# Patient Record
Sex: Female | Born: 1990 | Race: Black or African American | Hispanic: No | Marital: Single | Smoking: Never smoker
Health system: Southern US, Community
[De-identification: ages and names within clinical notes are randomized; demographics above are authoritative.]

## PROBLEM LIST (undated history)

## (undated) HISTORY — PX: TUBAL LIGATION: SHX77

## (undated) HISTORY — PX: FETAL SURGERY FOR CONGENITAL HERNIA: SHX1618

---
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
## 2010-02-20 ENCOUNTER — Emergency Department (HOSPITAL_COMMUNITY)
Admission: EM | Admit: 2010-02-20 | Discharge: 2010-02-20 | Payer: Self-pay | Source: Home / Self Care | Admitting: Emergency Medicine

## 2010-02-22 ENCOUNTER — Emergency Department (HOSPITAL_COMMUNITY)
Admission: EM | Admit: 2010-02-22 | Discharge: 2010-02-22 | Payer: Self-pay | Source: Home / Self Care | Admitting: Emergency Medicine

## 2010-02-23 LAB — URINE MICROSCOPIC-ADD ON

## 2010-02-23 LAB — URINALYSIS, ROUTINE W REFLEX MICROSCOPIC
Hgb urine dipstick: NEGATIVE
Specific Gravity, Urine: 1.027 (ref 1.005–1.030)
Urine Glucose, Fasting: NEGATIVE mg/dL

## 2010-04-20 LAB — DIFFERENTIAL
Eosinophils Relative: 1 % (ref 0–5)
Lymphocytes Relative: 23 % (ref 12–46)
Lymphs Abs: 2.4 10*3/uL (ref 0.7–4.0)

## 2010-04-20 LAB — ETHANOL: Alcohol, Ethyl (B): <5 mg/dL (ref 0–10)

## 2010-04-20 LAB — ACETAMINOPHEN LEVEL: Acetaminophen (Tylenol), Serum: 52.4 ug/mL — ABNORMAL HIGH (ref 10–30)

## 2010-04-20 LAB — BASIC METABOLIC PANEL
BUN: 17 mg/dL (ref 6–23)
GFR calc non Af Amer: >60 mL/min (ref >60)
Potassium: 4 mEq/L (ref 3.5–5.1)
Sodium: 141 mEq/L (ref 135–145)

## 2010-04-20 LAB — URINALYSIS, ROUTINE W REFLEX MICROSCOPIC
Bilirubin Urine: NEGATIVE
Ketones, ur: NEGATIVE mg/dL
Nitrite: NEGATIVE
Protein, ur: NEGATIVE mg/dL
Urobilinogen, UA: 1 mg/dL (ref 0.0–1.0)

## 2010-04-20 LAB — CBC
HCT: 38.5 % (ref 36.0–46.0)
Platelets: 140 10*3/uL — ABNORMAL LOW (ref 150–400)
WBC: 10.4 10*3/uL (ref 4.0–10.5)

## 2010-04-20 LAB — URINE MICROSCOPIC-ADD ON

## 2010-04-23 LAB — RAPID STREP SCREEN (MED CTR MEBANE ONLY): Streptococcus, Group A Screen (Direct): NEGATIVE

## 2010-05-31 ENCOUNTER — Emergency Department (HOSPITAL_COMMUNITY)
Admission: EM | Admit: 2010-05-31 | Discharge: 2010-05-31 | Disposition: A | Discharge status: Home / Self Care | Payer: Self-pay | Attending: Emergency Medicine | Admitting: Emergency Medicine

## 2010-05-31 DIAGNOSIS — M940 Chondrocostal junction syndrome [Tietze]: Secondary | ICD-10-CM | POA: Insufficient documentation

## 2010-05-31 DIAGNOSIS — R0789 Other chest pain: Secondary | ICD-10-CM | POA: Insufficient documentation

## 2010-07-14 ENCOUNTER — Inpatient Hospital Stay (INDEPENDENT_AMBULATORY_CARE_PROVIDER_SITE_OTHER)
Admission: RE | Admit: 2010-07-14 | Discharge: 2010-07-14 | Disposition: A | Discharge status: Home / Self Care | Payer: Self-pay | Source: Ambulatory Visit | Attending: Family Medicine | Admitting: Family Medicine

## 2010-07-14 DIAGNOSIS — R112 Nausea with vomiting, unspecified: Secondary | ICD-10-CM

## 2010-07-14 LAB — POCT URINALYSIS DIP (DEVICE)
Bilirubin Urine: NEGATIVE
Glucose, UA: NEGATIVE mg/dL
Ketones, ur: NEGATIVE mg/dL
Protein, ur: NEGATIVE mg/dL
Specific Gravity, Urine: 1.02 (ref 1.005–1.030)

## 2010-07-14 LAB — POCT PREGNANCY, URINE: Preg Test, Ur: NEGATIVE

## 2010-12-17 ENCOUNTER — Emergency Department (INDEPENDENT_AMBULATORY_CARE_PROVIDER_SITE_OTHER)
Admission: EM | Admit: 2010-12-17 | Discharge: 2010-12-17 | Disposition: A | Discharge status: Home / Self Care | Payer: Self-pay | Source: Home / Self Care | Attending: Family Medicine | Admitting: Family Medicine

## 2010-12-17 DIAGNOSIS — N898 Other specified noninflammatory disorders of vagina: Secondary | ICD-10-CM

## 2010-12-17 LAB — WET PREP, GENITAL: Yeast Wet Prep HPF POC: NONE SEEN

## 2010-12-17 LAB — POCT PREGNANCY, URINE: Preg Test, Ur: NEGATIVE

## 2010-12-17 MED ORDER — METRONIDAZOLE 500 MG PO TABS: 500.0000 mg | ORAL_TABLET | Freq: Two times a day (BID) | ORAL | Status: AC

## 2010-12-17 NOTE — ED Provider Notes (Signed)
History     CSN: [id number] Arrival date & time: 12/17/2010  4:27 PM   First MD Initiated Contact with Patient 12/17/10 1644      Chief Complaint  Patient presents with  . Vaginal Discharge    (Consider location/radiation/quality/duration/timing/severity/associated sxs/prior treatment) HPI Comments: Kristen Robles presents for evaluation of new onset malodorous, vaginal discharge. She reports a new sexual partner, with whom she uses condoms. She has never had a discharge like this before.  Patient is a 20 y.o. female presenting with vaginal discharge. The history is provided by the patient.  Vaginal Discharge This is a new problem. The problem occurs constantly. Pertinent negatives include no abdominal pain.    History reviewed. No pertinent past medical history.  History reviewed. No pertinent past surgical history.  No family history on file.  History  Substance Use Topics  . Smoking status: Never Smoker   . Smokeless tobacco: Not on file  . Alcohol Use: No    OB History    Grav Para Term Preterm Abortions TAB SAB Ect Mult Living                  Review of Systems  Constitutional: Negative.   HENT: Negative.   Eyes: Negative.   Respiratory: Negative.   Gastrointestinal: Negative.  Negative for abdominal pain.  Genitourinary: Positive for vaginal discharge.  Musculoskeletal: Negative.   Neurological: Negative.     Allergies  Review of patient's allergies indicates no known allergies.  Home Medications  No current outpatient prescriptions on file.  BP 125/68  Pulse 91  Temp(Src) 98.4 F (36.9 C) (Oral)  Resp 16  SpO2 100%  LMP 12/03/2010  Physical Exam  Constitutional: She appears well-developed and well-nourished.  Eyes: EOM are normal.  Pulmonary/Chest: Effort normal.  Genitourinary: Cervix exhibits no discharge and no friability. No tenderness or bleeding around the vagina. Vaginal discharge found.    ED Course  Procedures (including critical care  time)   Labs Reviewed  POCT PREGNANCY, URINE  POCT URINALYSIS DIPSTICK   No results found.   No diagnosis found.    MDM  Exam reveals thin, milky discharge, consistent with possible BV; will treat empirically, labs pending.        Richardo Priest, MD 12/17/10 [phone]

## 2010-12-17 NOTE — ED Notes (Signed)
C/o vaginal discharge with odor for 2 days.  Denies itching or urinary sx. Denies pain

## 2010-12-18 LAB — GC/CHLAMYDIA PROBE AMP, GENITAL
Chlamydia, DNA Probe: NEGATIVE
GC Probe Amp, Genital: NEGATIVE

## 2011-05-05 ENCOUNTER — Emergency Department (HOSPITAL_COMMUNITY)
Admission: EM | Admit: 2011-05-05 | Discharge: 2011-05-05 | Disposition: A | Discharge status: Home / Self Care | Payer: Self-pay | Attending: Emergency Medicine | Admitting: Emergency Medicine

## 2011-05-05 ENCOUNTER — Encounter (HOSPITAL_COMMUNITY): Payer: Self-pay

## 2011-05-05 DIAGNOSIS — R112 Nausea with vomiting, unspecified: Secondary | ICD-10-CM | POA: Insufficient documentation

## 2011-05-05 DIAGNOSIS — R197 Diarrhea, unspecified: Secondary | ICD-10-CM | POA: Insufficient documentation

## 2011-05-05 DIAGNOSIS — R109 Unspecified abdominal pain: Secondary | ICD-10-CM | POA: Insufficient documentation

## 2011-05-05 LAB — URINALYSIS, ROUTINE W REFLEX MICROSCOPIC
Hgb urine dipstick: NEGATIVE
Protein, ur: NEGATIVE mg/dL
Urobilinogen, UA: 0.2 mg/dL (ref 0.0–1.0)

## 2011-05-05 LAB — PREGNANCY, URINE: Preg Test, Ur: NEGATIVE

## 2011-05-05 MED ORDER — DICYCLOMINE HCL 10 MG PO CAPS
10.0000 mg | ORAL_CAPSULE | ORAL | Status: AC
  Administered 2011-05-05: 10 mg via ORAL
  Filled 2011-05-05: qty 1

## 2011-05-05 MED ORDER — ONDANSETRON HCL 4 MG PO TABS: 4.0000 mg | ORAL_TABLET | Freq: Four times a day (QID) | ORAL | Status: AC

## 2011-05-05 MED ORDER — ONDANSETRON 4 MG PO TBDP
4.0000 mg | ORAL_TABLET | Freq: Once | ORAL | Status: AC
  Administered 2011-05-05: 4 mg via ORAL
  Filled 2011-05-05 (×2): qty 1

## 2011-05-05 MED ORDER — DICYCLOMINE HCL 20 MG PO TABS: 20.0000 mg | ORAL_TABLET | Freq: Two times a day (BID) | ORAL | Status: DC

## 2011-05-05 NOTE — ED Notes (Signed)
Patient states she is having abdomen and bilateral side pain and N/V/D x 5 days. Patient denies fever.

## 2011-05-05 NOTE — ED Provider Notes (Signed)
History     CSN: [id number]  Arrival date & time 05/05/11  1525   First MD Initiated Contact with Patient 05/05/11 1649      Chief Complaint  Patient presents with  . Abdominal Pain    (Consider location/radiation/quality/duration/timing/severity/associated sxs/prior treatment) HPI  Patient who states she has no known medical problems and takes no medicine on regular basis presents to emergency department complaining of a 5-6 day history of intermittent lower abdominal cramping with nausea vomiting and diarrhea. Patient states that for the last 5 days she'll have intermittent lower abdominal cramping that she states is "very sharp and severe" and will last for a few minutes then resolve. These episodes of abdominal cramping are at imes associated with nausea vomiting diarrhea and at other times she will have pain alone. Patient states she's felt nauseated for the last 5 days and has had vomiting times multiple episodes intermittently. However patient has been able to tolerate bland food and water despite intermittent vomiting. Patient denies any known fevers, chills, chest pain, shortness of breath, dysuria, hematuria, blood in her stool, vaginal discharge or pelvic pain. She denies recent abx use. Patient denies any sick contacts or family or friends with similar symptoms. Patient states she has no known medical problems and no history of surgeries. Patient states she's taken Pepto-Bismol and over-the-counter pain medicines over the last 5 days with some temporary relief of symptoms. Patient denies history of similar pain.  History reviewed. No pertinent past medical history.  History reviewed. No pertinent past surgical history.  No family history on file.  History  Substance Use Topics  . Smoking status: Never Smoker   . Smokeless tobacco: Not on file  . Alcohol Use: No    OB History    Grav Para Term Preterm Abortions TAB SAB Ect Mult Living                  Review of Systems   All other systems reviewed and are negative.    Allergies  Tomato  Home Medications   Current Outpatient Rx  Name Route Sig Dispense Refill  . PAIN RELIEF PO Oral Take 2 tablets by mouth as needed. For pain.      BP 101/69  Pulse 70  Temp(Src) 98.3 F (36.8 C) (Oral)  Resp 18  SpO2 100%  LMP 04/25/2011  Physical Exam  Nursing note and vitals reviewed. Constitutional: She is oriented to person, place, and time. She appears well-developed and well-nourished. No distress.  HENT:  Head: Normocephalic and atraumatic.  Eyes: Conjunctivae are normal.  Neck: Normal range of motion. Neck supple.  Cardiovascular: Normal rate, regular rhythm, normal heart sounds and intact distal pulses.  Exam reveals no gallop and no friction rub.   No murmur heard. Pulmonary/Chest: Effort normal and breath sounds normal. No respiratory distress. She has no wheezes. She has no rales. She exhibits no tenderness.  Abdominal: Soft. Bowel sounds are normal. She exhibits no distension and no mass. There is no tenderness. There is no rebound and no guarding.  Musculoskeletal: Normal range of motion. She exhibits no edema and no tenderness.  Neurological: She is alert and oriented to person, place, and time.  Skin: Skin is warm and dry. No rash noted. She is not diaphoretic. No erythema.  Psychiatric: She has a normal mood and affect.    ED Course  Procedures (including critical care time)  ODT zofran and PO bentyl.    Labs Reviewed  PREGNANCY, URINE  URINALYSIS, ROUTINE  W REFLEX MICROSCOPIC   No results found.   1. Abdominal cramping   2. Nausea vomiting and diarrhea       MDM  Patient is afebrile, with abdomen completely soft and nontender denying any vaginal d/c or pelvic pain. She is tolerating by mouth fluids well. Patient is describing intermittent abdominal cramping with nausea, vomiting, and diarrhea potentially consistent with gastroenteritis versus IBS. Patient has nonacute, non  tender abdomen at this time and throughout ER evaluation. Will send her home with Zofran and Bentyl with primary care referral. Spoke at length with patient about worrisome signs and symptoms that should prompt return to ER. She was in understanding and is agreeable to plan.        Jenness Corner, [place] 05/05/11 1810

## 2011-05-05 NOTE — ED Notes (Signed)
Abdominal pain n/v/d

## 2011-05-05 NOTE — Discharge Instructions (Signed)
Take bentyl as directed for abdominal cramping and pain and zofran as needed for nausea. You may also consider over-the-counter antidiarrheal such as Lomotil. Follow up with a primary care provider in 1-2 weeks for recheck of ongoing symptoms but returned to emergency department for changing or worsening symptoms.  Abdominal Pain Abdominal pain can be caused by many things. Your caregiver decides the seriousness of your pain by an examination and possibly blood tests and X-rays. Many cases can be observed and treated at home. Most abdominal pain is not caused by a disease and will probably improve without treatment. However, in many cases, more time must pass before a clear cause of the pain can be found. Before that point, it may not be known if you need more testing, or if hospitalization or surgery is needed. HOME CARE INSTRUCTIONS   Do not take laxatives unless directed by your caregiver.   Take pain medicine only as directed by your caregiver.   Only take over-the-counter or prescription medicines for pain, discomfort, or fever as directed by your caregiver.   Try a clear liquid diet (broth, tea, or water) for as long as directed by your caregiver. Slowly move to a bland diet as tolerated.  SEEK IMMEDIATE MEDICAL CARE IF:   The pain does not go away.   You have a fever.   You keep throwing up (vomiting).   The pain is felt only in portions of the abdomen. Pain in the right side could possibly be appendicitis. In an adult, pain in the left lower portion of the abdomen could be colitis or diverticulitis.   You pass bloody or black tarry stools.  MAKE SURE YOU:   Understand these instructions.   Will watch your condition.   Will get help right away if you are not doing well or get worse.  Document Released: 10/27/2004 Document Revised: 01/06/2011 Document Reviewed: 09/05/2007 [facility] Patient Information 2012 [place], [place].

## 2011-05-06 NOTE — ED Provider Notes (Signed)
Medical screening examination/treatment/procedure(s) were performed by non-physician practitioner and as supervising physician I was immediately available for consultation/collaboration.   Loren Racer, MD 05/06/11 [phone]

## 2011-09-03 ENCOUNTER — Encounter (HOSPITAL_COMMUNITY): Payer: Self-pay | Admitting: *Deleted

## 2011-09-03 ENCOUNTER — Emergency Department (HOSPITAL_COMMUNITY)
Admission: EM | Admit: 2011-09-03 | Discharge: 2011-09-04 | Disposition: A | Discharge status: Home / Self Care | Payer: Self-pay | Attending: Emergency Medicine | Admitting: Emergency Medicine

## 2011-09-03 DIAGNOSIS — R112 Nausea with vomiting, unspecified: Secondary | ICD-10-CM

## 2011-09-03 DIAGNOSIS — R197 Diarrhea, unspecified: Secondary | ICD-10-CM | POA: Insufficient documentation

## 2011-09-03 DIAGNOSIS — R109 Unspecified abdominal pain: Secondary | ICD-10-CM | POA: Insufficient documentation

## 2011-09-03 LAB — COMPREHENSIVE METABOLIC PANEL
Alkaline Phosphatase: 36 U/L — ABNORMAL LOW (ref 39–117)
BUN: 12 mg/dL (ref 6–23)
Chloride: 102 mEq/L (ref 96–112)
GFR calc Af Amer: >90 mL/min (ref >90)
Glucose, Bld: 97 mg/dL (ref 70–99)
Potassium: 3.4 mEq/L — ABNORMAL LOW (ref 3.5–5.1)
Total Bilirubin: 0.3 mg/dL (ref 0.3–1.2)

## 2011-09-03 LAB — PREGNANCY, URINE: Preg Test, Ur: NEGATIVE

## 2011-09-03 LAB — CBC WITH DIFFERENTIAL/PLATELET
Basophils Absolute: 0 K/uL (ref 0.0–0.1)
Basophils Relative: 0 % (ref 0–1)
Eosinophils Absolute: 0.1 K/uL (ref 0.0–0.7)
Eosinophils Relative: 1 % (ref 0–5)
HCT: 39.4 % (ref 36.0–46.0)
Hemoglobin: 13.5 g/dL (ref 12.0–15.0)
Lymphocytes Relative: 29 % (ref 12–46)
Lymphs Abs: 2.5 10*3/uL (ref 0.7–4.0)
MCH: 33.2 pg (ref 26.0–34.0)
MCHC: 34.3 g/dL (ref 30.0–36.0)
MCV: 96.8 fL (ref 78.0–100.0)
Monocytes Absolute: 0.6 K/uL (ref 0.1–1.0)
Monocytes Relative: 7 % (ref 3–12)
Neutro Abs: 5.6 10*3/uL (ref 1.7–7.7)
Neutrophils Relative %: 63 % (ref 43–77)
Platelets: 142 K/uL — ABNORMAL LOW (ref 150–400)
RBC: 4.07 MIL/uL (ref 3.87–5.11)
RDW: 12.2 % (ref 11.5–15.5)
WBC: 8.8 K/uL (ref 4.0–10.5)

## 2011-09-03 LAB — COMPREHENSIVE METABOLIC PANEL WITH GFR
ALT: 7 U/L (ref 0–35)
AST: 17 U/L (ref 0–37)
Albumin: 4.5 g/dL (ref 3.5–5.2)
CO2: 26 meq/L (ref 19–32)
Calcium: 9.6 mg/dL (ref 8.4–10.5)
Creatinine, Ser: 0.67 mg/dL (ref 0.50–1.10)
GFR calc non Af Amer: >90 mL/min (ref >90)
Sodium: 139 meq/L (ref 135–145)
Total Protein: 7.9 g/dL (ref 6.0–8.3)

## 2011-09-03 LAB — URINALYSIS, ROUTINE W REFLEX MICROSCOPIC
Bilirubin Urine: NEGATIVE
Glucose, UA: NEGATIVE mg/dL
Hgb urine dipstick: NEGATIVE
Ketones, ur: 15 mg/dL — AB
Nitrite: NEGATIVE
Protein, ur: NEGATIVE mg/dL
Specific Gravity, Urine: 1.025 (ref 1.005–1.030)
Urobilinogen, UA: 1 mg/dL (ref 0.0–1.0)
pH: 6.5 (ref 5.0–8.0)

## 2011-09-03 LAB — URINE MICROSCOPIC-ADD ON

## 2011-09-03 NOTE — ED Notes (Signed)
Lower abd pain for one week with nv and diarrhea with lower back pain and a headache.  lmp last month

## 2011-09-04 ENCOUNTER — Encounter (HOSPITAL_COMMUNITY): Payer: Self-pay | Admitting: Emergency Medicine

## 2011-09-04 MED ORDER — SODIUM CHLORIDE 0.9 % IV BOLUS (SEPSIS)
1000.0000 mL | Freq: Once | INTRAVENOUS | Status: AC
  Administered 2011-09-04: 1000 mL via INTRAVENOUS

## 2011-09-04 MED ORDER — ONDANSETRON 8 MG PO TBDP: 8.0000 mg | ORAL_TABLET | Freq: Two times a day (BID) | ORAL | Status: AC | PRN

## 2011-09-04 MED ORDER — PANTOPRAZOLE SODIUM 40 MG PO TBEC: 40.0000 mg | DELAYED_RELEASE_TABLET | Freq: Every day | ORAL | Status: DC

## 2011-09-04 MED ORDER — FENTANYL CITRATE 0.05 MG/ML IJ SOLN
50.0000 ug | Freq: Once | INTRAMUSCULAR | Status: AC
  Administered 2011-09-04: 50 ug via INTRAVENOUS
  Filled 2011-09-04: qty 2

## 2011-09-04 MED ORDER — ONDANSETRON HCL 4 MG/2ML IJ SOLN
4.0000 mg | Freq: Once | INTRAMUSCULAR | Status: AC
  Administered 2011-09-04: 4 mg via INTRAVENOUS
  Filled 2011-09-04: qty 2

## 2011-09-04 MED ORDER — PANTOPRAZOLE SODIUM 40 MG IV SOLR
40.0000 mg | Freq: Once | INTRAVENOUS | Status: AC
  Administered 2011-09-04: 40 mg via INTRAVENOUS
  Filled 2011-09-04: qty 40

## 2011-09-04 MED ORDER — LOPERAMIDE HCL 2 MG PO CAPS: 2.0000 mg | ORAL_CAPSULE | Freq: Four times a day (QID) | ORAL | Status: AC | PRN

## 2011-09-04 NOTE — ED Notes (Signed)
Patient is alert and oriented x4 with no complaints of pain.  Patient's friends are here to transport her home.  Discharge instructions were explained and patient had no questions.

## 2011-09-04 NOTE — ED Provider Notes (Addendum)
History     CSN: [id number]  Arrival date & time 09/03/11  2206   First MD Initiated Contact with Patient 09/03/11 2357      Chief Complaint  Patient presents with  . Abdominal Pain    (Consider location/radiation/quality/duration/timing/severity/associated sxs/prior treatment) HPI Comments: Patient reports that she began having some loose stools approximately 3 days ago associated with some resultant lower abdominal crampy discomfort that is intermittent and will radiate to her sides. She had approximately 6 or 7 episodes of diarrhea in the last 24 hours. She denies smoking or drug swollen very rarely drink a glass of wine and has not had any alcohol recently. She denies any fever or chills. Her appetite has been down. She has not taken any medications for the diarrhea. Today the symptoms got worse and she reports that she vomited profusely after trying to eat and drink small amounts. Therefore she came to the emergency department. At this moment, she reports no abdominal pain or tenderness. She denies dysuria. She reports that she had 2 menstrual cycles last month which is somewhat unusual for her but denies any current vaginal bleeding or discharge. She reports that she does use condoms for birth control. She denies any foreign travel or any obvious sick contacts.  Patient is a 21 y.o. female presenting with abdominal pain. The history is provided by the patient and a friend.  Abdominal Pain The primary symptoms of the illness include abdominal pain, nausea, vomiting and diarrhea. The primary symptoms of the illness do not include fever, vaginal discharge or vaginal bleeding.  Symptoms associated with the illness do not include chills, urgency or back pain.    History reviewed. No pertinent past medical history.  History reviewed. No pertinent past surgical history.  History reviewed. No pertinent family history.  History  Substance Use Topics  . Smoking status: Never Smoker   .  Smokeless tobacco: Not on file  . Alcohol Use: 0.6 oz/week    1 Glasses of wine per week    OB History    Grav Para Term Preterm Abortions TAB SAB Ect Mult Living                  Review of Systems  Constitutional: Positive for appetite change. Negative for fever and chills.  HENT: Negative for congestion, rhinorrhea and postnasal drip.   Gastrointestinal: Positive for nausea, vomiting, abdominal pain and diarrhea.  Genitourinary: Negative for urgency, flank pain, vaginal bleeding, vaginal discharge, vaginal pain and pelvic pain.  Musculoskeletal: Negative for back pain.  Neurological: Negative for syncope and light-headedness.  All other systems reviewed and are negative.    Allergies  Tomato  Home Medications   Current Outpatient Rx  Name Route Sig Dispense Refill  . IBUPROFEN 200 MG PO TABS Oral Take 400 mg by mouth every 6 (six) hours as needed. For pain    . LOPERAMIDE HCL 2 MG PO CAPS Oral Take 1 capsule (2 mg total) by mouth 4 (four) times daily as needed for diarrhea or loose stools. 12 capsule 0  . ONDANSETRON 8 MG PO TBDP Oral Take 1 tablet (8 mg total) by mouth every 12 (twelve) hours as needed for nausea. 20 tablet 0  . PANTOPRAZOLE SODIUM 40 MG PO TBEC Oral Take 1 tablet (40 mg total) by mouth daily. 14 tablet 0    BP 130/71  Pulse 82  Temp 97.6 F (36.4 C) (Oral)  Resp 18  Ht 5\' 2"  (1.575 m)  Wt 105 lb (  47.628 kg)  BMI 19.20 kg/m2  SpO2 99%  LMP 08/03/2011  Physical Exam  Nursing note and vitals reviewed. Constitutional: She is oriented to person, place, and time. She appears well-developed and well-nourished.  HENT:  Head: Normocephalic and atraumatic.  Mouth/Throat: Uvula is midline.       Minimally dry MM  Eyes: Pupils are equal, round, and reactive to light. No scleral icterus.  Neck: Normal range of motion. Neck supple.  Pulmonary/Chest: Effort normal. No respiratory distress. She has no wheezes.  Abdominal: Soft. Bowel sounds are normal. She  exhibits no distension. There is no tenderness. There is no rebound and no guarding.  Musculoskeletal: Normal range of motion.  Neurological: She is alert and oriented to person, place, and time. No cranial nerve deficit.  Skin: Skin is warm and dry.    ED Course  Procedures (including critical care time)  Labs Reviewed  URINALYSIS, ROUTINE W REFLEX MICROSCOPIC - Abnormal; Notable for the following:    APPearance CLOUDY (*)     Ketones, ur 15 (*)     Leukocytes, UA SMALL (*)     All other components within normal limits  CBC WITH DIFFERENTIAL - Abnormal; Notable for the following:    Platelets 142 (*)     All other components within normal limits  COMPREHENSIVE METABOLIC PANEL - Abnormal; Notable for the following:    Potassium 3.4 (*)     Alkaline Phosphatase 36 (*)     All other components within normal limits  URINE MICROSCOPIC-ADD ON - Abnormal; Notable for the following:    Squamous Epithelial / LPF MANY (*)     Bacteria, UA MANY (*)     All other components within normal limits  PREGNANCY, URINE  URINE CULTURE   No results found.   1. Nausea vomiting and diarrhea     12:49 AM Urinalysis is likely contaminated. However given she does have some lower abdominal discomfort, will send as a culture for followup.  2:59 AM Patient reexamination shows a soft non-tender without guarding or rebound examination. Patient has not vomited any further. She received IV fluids. She reports that the IV pain medicine it made her a little dizzy. I gave her some additional IV antiemetic and she has tolerated by mouth fluids. Will discharge her home and she can return if pain or fever returns and otherwise she can take medication for nausea and diarrhea at home and instructions on Brat diet return as well.  MDM   Appears well, afebrile and normotensive. She likely is mildly dehydrated do to several days of diarrheal stools. Her abdomen is soft and no guarding or rebound. I do not suspect  any pelvic or GYN symptoms given the diarrhea and vomiting. Plan is to give her IV fluids, IV antiemetics and some mild analgesics and Protonix and will monitor her and reassess for improvement. Her urinalysis showed mild ketones but no other abnormalities. The rest of her blood tests are unremarkable. Her hCG is negative.      Gavin Pound. Kasee Hantz, MD 09/04/11 0300  Gavin Pound. Oletta Lamas, MD 09/04/11 1610  Gavin Pound. Sinahi Knights, MD 09/04/11 [id number]

## 2011-09-04 NOTE — Discharge Instructions (Signed)
 B.R.A.T. Diet Your doctor has recommended the B.R.A.T. diet for you or your child until the condition improves. This is often used to help control diarrhea and vomiting symptoms. If you or your child can tolerate clear liquids, you may have:  Bananas.   Rice.   Applesauce.   Toast (and other simple starches such as crackers, potatoes, noodles).  Be sure to avoid dairy products, meats, and fatty foods until symptoms are better. Fruit juices such as apple, grape, and prune juice can make diarrhea worse. Avoid these. Continue this diet for 2 days or as instructed by your caregiver. Document Released: 01/17/2005 Document Revised: 01/06/2011 Document Reviewed: 07/06/2006 [facility] Patient Information 2012 [place], [place].

## 2011-09-05 LAB — URINE CULTURE: Colony Count: 7000

## 2014-05-29 ENCOUNTER — Emergency Department (HOSPITAL_COMMUNITY): Payer: No Typology Code available for payment source

## 2014-05-29 ENCOUNTER — Emergency Department (HOSPITAL_COMMUNITY)
Admission: EM | Admit: 2014-05-29 | Discharge: 2014-05-29 | Disposition: A | Discharge status: Home / Self Care | Payer: No Typology Code available for payment source | Attending: Emergency Medicine | Admitting: Emergency Medicine

## 2014-05-29 ENCOUNTER — Encounter (HOSPITAL_COMMUNITY): Payer: Self-pay

## 2014-05-29 DIAGNOSIS — S79921A Unspecified injury of right thigh, initial encounter: Secondary | ICD-10-CM | POA: Insufficient documentation

## 2014-05-29 DIAGNOSIS — Y92410 Unspecified street and highway as the place of occurrence of the external cause: Secondary | ICD-10-CM | POA: Insufficient documentation

## 2014-05-29 DIAGNOSIS — M542 Cervicalgia: Secondary | ICD-10-CM

## 2014-05-29 DIAGNOSIS — R42 Dizziness and giddiness: Secondary | ICD-10-CM | POA: Diagnosis not present

## 2014-05-29 DIAGNOSIS — M545 Low back pain, unspecified: Secondary | ICD-10-CM

## 2014-05-29 DIAGNOSIS — S3992XA Unspecified injury of lower back, initial encounter: Secondary | ICD-10-CM | POA: Diagnosis present

## 2014-05-29 DIAGNOSIS — Y998 Other external cause status: Secondary | ICD-10-CM | POA: Insufficient documentation

## 2014-05-29 DIAGNOSIS — M25562 Pain in left knee: Secondary | ICD-10-CM

## 2014-05-29 DIAGNOSIS — S8992XA Unspecified injury of left lower leg, initial encounter: Secondary | ICD-10-CM | POA: Insufficient documentation

## 2014-05-29 DIAGNOSIS — Y9389 Activity, other specified: Secondary | ICD-10-CM | POA: Insufficient documentation

## 2014-05-29 DIAGNOSIS — S199XXA Unspecified injury of neck, initial encounter: Secondary | ICD-10-CM | POA: Diagnosis not present

## 2014-05-29 MED ORDER — METHOCARBAMOL 500 MG PO TABS: 500.0000 mg | ORAL_TABLET | Freq: Two times a day (BID) | ORAL | Status: DC

## 2014-05-29 MED ORDER — NAPROXEN 500 MG PO TABS: 500.0000 mg | ORAL_TABLET | Freq: Two times a day (BID) | ORAL | Status: DC

## 2014-05-29 MED ORDER — HYDROCODONE-ACETAMINOPHEN 5-325 MG PO TABS
2.0000 | ORAL_TABLET | Freq: Once | ORAL | Status: AC
  Administered 2014-05-29: 2 via ORAL
  Filled 2014-05-29: qty 2

## 2014-05-29 MED ORDER — TRAMADOL HCL 50 MG PO TABS: 50.0000 mg | ORAL_TABLET | Freq: Four times a day (QID) | ORAL | Status: DC | PRN
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
## 2022-03-20 ENCOUNTER — Emergency Department (HOSPITAL_COMMUNITY): Payer: Medicaid Other

## 2022-03-20 ENCOUNTER — Emergency Department (HOSPITAL_BASED_OUTPATIENT_CLINIC_OR_DEPARTMENT_OTHER): Payer: Medicaid Other

## 2022-03-20 ENCOUNTER — Other Ambulatory Visit: Payer: Self-pay

## 2022-03-20 ENCOUNTER — Inpatient Hospital Stay (HOSPITAL_COMMUNITY)
Admission: EM | Admit: 2022-03-20 | Discharge: 2022-03-28 | DRG: 464 | Disposition: A | Discharge status: Home / Self Care | Payer: Medicaid Other | Attending: Internal Medicine | Admitting: Internal Medicine

## 2022-03-20 ENCOUNTER — Encounter (HOSPITAL_COMMUNITY): Payer: Self-pay | Admitting: Emergency Medicine

## 2022-03-20 DIAGNOSIS — K219 Gastro-esophageal reflux disease without esophagitis: Secondary | ICD-10-CM | POA: Diagnosis present

## 2022-03-20 DIAGNOSIS — M009 Pyogenic arthritis, unspecified: Principal | ICD-10-CM | POA: Diagnosis present

## 2022-03-20 DIAGNOSIS — Z885 Allergy status to narcotic agent status: Secondary | ICD-10-CM

## 2022-03-20 DIAGNOSIS — D649 Anemia, unspecified: Secondary | ICD-10-CM | POA: Diagnosis not present

## 2022-03-20 DIAGNOSIS — M79609 Pain in unspecified limb: Secondary | ICD-10-CM

## 2022-03-20 DIAGNOSIS — N939 Abnormal uterine and vaginal bleeding, unspecified: Secondary | ICD-10-CM | POA: Diagnosis present

## 2022-03-20 DIAGNOSIS — E876 Hypokalemia: Secondary | ICD-10-CM | POA: Diagnosis present

## 2022-03-20 DIAGNOSIS — L02415 Cutaneous abscess of right lower limb: Secondary | ICD-10-CM | POA: Diagnosis present

## 2022-03-20 DIAGNOSIS — M79651 Pain in right thigh: Secondary | ICD-10-CM

## 2022-03-20 LAB — COMPREHENSIVE METABOLIC PANEL
ALT: 17 U/L (ref 0–44)
AST: 21 U/L (ref 15–41)
Albumin: 3.7 g/dL (ref 3.5–5.0)
Alkaline Phosphatase: 43 U/L (ref 38–126)
Anion gap: 9 (ref 5–15)
BUN: 10 mg/dL (ref 6–20)
CO2: 26 mmol/L (ref 22–32)
Calcium: 9.3 mg/dL (ref 8.9–10.3)
Chloride: 99 mmol/L (ref 98–111)
Creatinine, Ser: 0.69 mg/dL (ref 0.44–1.00)
GFR, Estimated: >60 mL/min (ref >60)
Glucose, Bld: 112 mg/dL — ABNORMAL HIGH (ref 70–99)
Potassium: 3.6 mmol/L (ref 3.5–5.1)
Sodium: 134 mmol/L — ABNORMAL LOW (ref 135–145)
Total Bilirubin: 0.5 mg/dL (ref 0.3–1.2)
Total Protein: 7.4 g/dL (ref 6.5–8.1)

## 2022-03-20 LAB — CBC WITH DIFFERENTIAL/PLATELET
Abs Immature Granulocytes: 0.04 K/uL (ref 0.00–0.07)
Basophils Absolute: 0 K/uL (ref 0.0–0.1)
Basophils Relative: 0 %
Eosinophils Absolute: 0 K/uL (ref 0.0–0.5)
Eosinophils Relative: 0 %
HCT: 39.4 % (ref 36.0–46.0)
Hemoglobin: 13.1 g/dL (ref 12.0–15.0)
Immature Granulocytes: 0 %
Lymphocytes Relative: 15 %
Lymphs Abs: 2.2 K/uL (ref 0.7–4.0)
MCH: 32.4 pg (ref 26.0–34.0)
MCHC: 33.2 g/dL (ref 30.0–36.0)
MCV: 97.5 fL (ref 80.0–100.0)
Monocytes Absolute: 1.1 K/uL — ABNORMAL HIGH (ref 0.1–1.0)
Monocytes Relative: 8 %
Neutro Abs: 11.3 K/uL — ABNORMAL HIGH (ref 1.7–7.7)
Neutrophils Relative %: 77 %
Platelets: 161 K/uL (ref 150–400)
RBC: 4.04 MIL/uL (ref 3.87–5.11)
RDW: 13.8 % (ref 11.5–15.5)
WBC: 14.7 K/uL — ABNORMAL HIGH (ref 4.0–10.5)
nRBC: 0 % (ref 0.0–0.2)

## 2022-03-20 LAB — CK: Total CK: 92 U/L (ref 38–234)

## 2022-03-20 LAB — C-REACTIVE PROTEIN: CRP: 6.7 mg/dL — ABNORMAL HIGH

## 2022-03-20 LAB — SEDIMENTATION RATE: Sed Rate: 17 mm/hr (ref 0–22)

## 2022-03-20 LAB — I-STAT BETA HCG BLOOD, ED (MC, WL, AP ONLY): I-stat hCG, quantitative: <5 m[IU]/mL

## 2022-03-20 LAB — LACTIC ACID, PLASMA: Lactic Acid, Venous: 1 mmol/L (ref 0.5–1.9)

## 2022-03-20 MED ORDER — MORPHINE SULFATE (PF) 4 MG/ML IV SOLN
4.0000 mg | Freq: Once | INTRAVENOUS | Status: AC
  Administered 2022-03-20: 4 mg via INTRAVENOUS
  Filled 2022-03-20: qty 1

## 2022-03-20 MED ORDER — OXYCODONE-ACETAMINOPHEN 5-325 MG PO TABS: 2.0000 | ORAL_TABLET | Freq: Once | ORAL | Status: DC

## 2022-03-20 MED ORDER — VANCOMYCIN HCL 1500 MG/300ML IV SOLN
1500.0000 mg | Freq: Two times a day (BID) | INTRAVENOUS | Status: DC
  Administered 2022-03-20: 1500 mg via INTRAVENOUS
  Filled 2022-03-20 (×2): qty 300

## 2022-03-20 MED ORDER — PIPERACILLIN-TAZOBACTAM 3.375 G IVPB 30 MIN
3.3750 g | Freq: Once | INTRAVENOUS | Status: AC
  Administered 2022-03-20: 3.375 g via INTRAVENOUS
  Filled 2022-03-20: qty 50

## 2022-03-20 MED ORDER — IOHEXOL 350 MG/ML SOLN
75.0000 mL | Freq: Once | INTRAVENOUS | Status: AC | PRN
  Administered 2022-03-20: 75 mL via INTRAVENOUS

## 2022-03-20 MED ORDER — MORPHINE SULFATE (PF) 2 MG/ML IV SOLN
2.0000 mg | Freq: Once | INTRAVENOUS | Status: AC
  Administered 2022-03-20: 2 mg via INTRAVENOUS
  Filled 2022-03-20: qty 1

## 2022-03-20 MED ORDER — GADOBUTROL 1 MMOL/ML IV SOLN
7.5000 mL | Freq: Once | INTRAVENOUS | Status: AC | PRN
  Administered 2022-03-20: 7.5 mL via INTRAVENOUS

## 2022-03-20 MED ORDER — SODIUM CHLORIDE 0.9 % IV BOLUS (SEPSIS)
1000.0000 mL | Freq: Once | INTRAVENOUS | Status: AC
  Administered 2022-03-20: 1000 mL via INTRAVENOUS

## 2022-03-20 MED ORDER — ONDANSETRON HCL 4 MG/2ML IJ SOLN
4.0000 mg | Freq: Once | INTRAMUSCULAR | Status: AC
  Administered 2022-03-20: 4 mg via INTRAVENOUS
  Filled 2022-03-20: qty 2

## 2022-03-20 MED ORDER — LORAZEPAM 1 MG PO TABS
0.5000 mg | ORAL_TABLET | Freq: Once | ORAL | Status: AC
  Administered 2022-03-20: 0.5 mg via ORAL
  Filled 2022-03-20: qty 1

## 2022-03-20 MED ORDER — LIDOCAINE 5 % EX PTCH
1.0000 | MEDICATED_PATCH | Freq: Once | CUTANEOUS | Status: AC
  Administered 2022-03-20: 1 via TRANSDERMAL
  Filled 2022-03-20: qty 1

## 2022-03-20 NOTE — ED Notes (Signed)
Patient transported to MRI 

## 2022-03-20 NOTE — ED Provider Notes (Signed)
[facility] Provider Note   CSN: [id number] Arrival date & time: 03/20/22  1107     History  Chief Complaint  Patient presents with   Leg Pain    Kristen Robles is a 32 y.o. female with history of abnormal uterine bleeding, umbilical hernia with chief complaint of right leg pain.  Patient had moved a lot of heavy items on Friday and then drove in the car for 3 to 4 hours.  Noticed tightness in the leg after driving.  However woke up Saturday morning and was unable to bear weight, stating the pain is significant.  Pain located over anterior/medial thigh, described as sharp, stabbing, and worsened by, weightbearing, range of motion, and touch.  Improved by resting/non-weight bearing.  No hx of DVT/PE.  Denies redness or swelling or rash.  Denies weight loss or IVDU.  The history is provided by the patient and medical records.  Leg Pain     Home Medications Prior to Admission medications   Medication Sig Start Date End Date Taking? Authorizing Provider  ibuprofen (ADVIL,MOTRIN) 200 MG tablet Take 400 mg by mouth every 6 (six) hours as needed. For pain    [provider]  methocarbamol (ROBAXIN) 500 MG tablet Take 1 tablet (500 mg total) by mouth 2 (two) times daily. 05/29/14   Hyman Bible, PA-C  naproxen (NAPROSYN) 500 MG tablet Take 1 tablet (500 mg total) by mouth 2 (two) times daily. 05/29/14   Hyman Bible, PA-C  pantoprazole (PROTONIX) 40 MG tablet Take 1 tablet (40 mg total) by mouth daily. 09/04/11 09/03/12  Kingsley Spittle, MD  traMADol (ULTRAM) 50 MG tablet Take 1 tablet (50 mg total) by mouth every 6 (six) hours as needed. 05/29/14   Hyman Bible, PA-C      Allergies    Tomato    Review of Systems   Review of Systems  Musculoskeletal:        Right leg pain    Physical Exam Updated Vital Signs BP 125/89 (BP Location: Left Arm)   Pulse 99   Temp 99.3 F (37.4 C) (Oral)   Resp 18   Ht 5' 2"$  (1.575 m)    Wt 74.8 kg   LMP  (LMP Unknown)   SpO2 98%   BMI 30.18 kg/m  Physical Exam Vitals and nursing note reviewed.  Constitutional:      General: She is not in acute distress.    Appearance: She is well-developed. She is not ill-appearing, toxic-appearing or diaphoretic.  HENT:     Head: Normocephalic and atraumatic.  Eyes:     Conjunctiva/sclera: Conjunctivae normal.  Cardiovascular:     Rate and Rhythm: Normal rate and regular rhythm.     Heart sounds: No murmur heard.    Comments: PT and DP 2+ bilat Pulmonary:     Effort: Pulmonary effort is normal. No respiratory distress.     Breath sounds: Normal breath sounds.  Abdominal:     Palpations: Abdomen is soft.     Tenderness: There is no abdominal tenderness.  Musculoskeletal:        General: Tenderness present. No swelling or deformity.     Cervical back: Neck supple.     Right lower leg: No edema.     Left lower leg: No edema.       Legs:     Comments: Tenderness as indicated above.  Difficulty bearing weight on RLE.  Reduced ROM of upper RLE, mainly hip joint with  reported elicited pain on thigh from flexion though not much with extension.  No swelling, erythema, rash, bony deformity, tracking, or wound.  No leg shortening or malrotation.  Right knee and ankle with full ROM/strength.  LLE appears normal.  Compartments soft.  Skin:    General: Skin is warm and dry.     Capillary Refill: Capillary refill takes less than 2 seconds.  Neurological:     Mental Status: She is alert and oriented to person, place, and time.     Gait: Gait abnormal (Antalgic).  Psychiatric:        Mood and Affect: Mood normal.    ED Results / Procedures / Treatments   Labs (all labs ordered are listed, but only abnormal results are displayed) Labs Reviewed  CBC WITH DIFFERENTIAL/PLATELET - Abnormal; Notable for the following components:      Result Value   WBC 14.7 (*)    Neutro Abs 11.3 (*)    Monocytes Absolute 1.1 (*)    All other components  within normal limits  COMPREHENSIVE METABOLIC PANEL - Abnormal; Notable for the following components:   Sodium 134 (*)    Glucose, Bld 112 (*)    All other components within normal limits  C-REACTIVE PROTEIN - Abnormal; Notable for the following components:   CRP 6.7 (*)    All other components within normal limits  CULTURE, BLOOD (ROUTINE X 2)  CULTURE, BLOOD (ROUTINE X 2)  CK  SEDIMENTATION RATE  LACTIC ACID, PLASMA  LACTIC ACID, PLASMA  I-STAT BETA HCG BLOOD, ED ([facility], [facility], [facility] ONLY)    EKG None  Radiology CT Abdomen Pelvis W Contrast  Result Date: 03/20/2022 CLINICAL DATA:  Right groin adenopathy. EXAM: CT ABDOMEN AND PELVIS WITH CONTRAST TECHNIQUE: Multidetector CT imaging of the abdomen and pelvis was performed using the standard protocol following bolus administration of intravenous contrast. RADIATION DOSE REDUCTION: This exam was performed according to the departmental dose-optimization program which includes automated exposure control, adjustment of the mA and/or kV according to patient size and/or use of iterative reconstruction technique. CONTRAST:  53m OMNIPAQUE IOHEXOL 350 MG/ML SOLN COMPARISON:  None Available. FINDINGS: Lower chest: The lung bases are clear of acute process. No pleural effusion or pulmonary lesions. The heart is normal in size. No pericardial effusion. The distal esophagus and aorta are unremarkable. Hepatobiliary: No focal hepatic lesions or intrahepatic biliary dilatation. The gallbladder is normal. No common bile duct dilatation. Pancreas: Normal Spleen: Normal Adrenals/Urinary Tract: Normal Stomach/Bowel: The stomach, duodenum, small bowel and colon are unremarkable. The terminal ileum and appendix are. Vascular/Lymphatic: The aorta is normal in caliber. No dissection. The branch vessels are patent. The major venous structures are patent. No mesenteric or retroperitoneal mass or adenopathy. Small scattered lymph nodes are noted. Reproductive: The uterus and  ovaries are unremarkable. Other: No pelvic mass or adenopathy. Small amount of free pelvic fluid, likely physiologic. No inguinal mass or adenopathy. No abdominal wall hernia or subcutaneous lesions. Small right inguinal lymph nodes but no overt lymphadenopathy. Musculoskeletal: There is a moderate to large right hip joint effusion. I do not see any destructive bony changes to suggest septic arthritis but recommend correlation with clinical findings such as white count, fever and sed rate. No evidence of AVN. The pubic symphysis and SI joints are intact. IMPRESSION: 1. No acute abdominal/pelvic findings, mass lesions or adenopathy. 2. Small amount of free pelvic fluid, likely physiologic. 3. Abnormal right hip joint effusion. Could not exclude septic arthritis. Recommend clinical correlation. MRI right  hip without and with contrast may be helpful for further evaluation. Electronically Signed   By: Marijo Sanes M.D.   On: 03/20/2022 16:31   VAS [place] LOWER EXTREMITY VENOUS (DVT) (7a-7p)  Result Date: 03/20/2022  Lower Venous DVT Study Patient Name:  Kristen Robles  Date of Exam:   03/20/2022 Medical Rec #: [id number]           Accession #:    [id number] Date of Birth: Apr 09, 1990           Patient Gender: F Patient Age:   21 years Exam Location:  [facility] Procedure:      VAS [place] LOWER EXTREMITY VENOUS (DVT) Referring Phys: Dorise Bullion --------------------------------------------------------------------------------  Indications: Thigh pain.  Comparison Study: No prior study on file Performing Technologist: Sharion Dove RVS  Examination Guidelines: A complete evaluation includes B-mode imaging, spectral Doppler, color Doppler, and power Doppler as needed of all accessible portions of each vessel. Bilateral testing is considered an integral part of a complete examination. Limited examinations for reoccurring indications may be performed as noted. The reflux portion of the exam is performed with the  patient in reverse Trendelenburg.  +---------+---------------+---------+-----------+--------------+--------------+ RIGHT    CompressibilityPhasicitySpontaneityProperties    Thrombus Aging +---------+---------------+---------+-----------+--------------+--------------+ CFV      Full                               pulsatile flow               +---------+---------------+---------+-----------+--------------+--------------+ SFJ      Full                                                            +---------+---------------+---------+-----------+--------------+--------------+ FV Prox  Full                               pulsatile flow               +---------+---------------+---------+-----------+--------------+--------------+ FV Mid   Full                                                            +---------+---------------+---------+-----------+--------------+--------------+ FV DistalFull                                                            +---------+---------------+---------+-----------+--------------+--------------+ PFV      Full                               pulsatile flow               +---------+---------------+---------+-----------+--------------+--------------+ POP      Full                                                            +---------+---------------+---------+-----------+--------------+--------------+  PTV      Full                                                            +---------+---------------+---------+-----------+--------------+--------------+ PERO     Full                                                            +---------+---------------+---------+-----------+--------------+--------------+   +----+---------------+---------+-----------+--------------+--------------+ LEFTCompressibilityPhasicitySpontaneityProperties    Thrombus Aging +----+---------------+---------+-----------+--------------+--------------+ CFV  Full                               pulsatile flow               +----+---------------+---------+-----------+--------------+--------------+     Summary: RIGHT: - There is no evidence of deep vein thrombosis in the lower extremity.  - No cystic structure found in the popliteal fossa. - Ultrasound characteristics of enlarged lymph nodes are noted in the groin. pulsatile flow noted  LEFT: - No evidence of common femoral vein obstruction. Pulsatile flow noted.  *See table(s) above for measurements and observations. Electronically signed by Deitra Mayo MD on 03/20/2022 at 3:39:46 PM.    Final     Procedures Procedures    Medications Ordered in ED Medications  lidocaine (LIDODERM) 5 % 1 patch (1 patch Transdermal Patch Applied 03/20/22 1325)  morphine (PF) 4 MG/ML injection 4 mg (4 mg Intravenous Given 03/20/22 1623)  ondansetron (ZOFRAN) injection 4 mg (4 mg Intravenous Given 03/20/22 1622)  iohexol (OMNIPAQUE) 350 MG/ML injection 75 mL (75 mLs Intravenous Contrast Given 03/20/22 1608)  morphine (PF) 2 MG/ML injection 2 mg (2 mg Intravenous Given 03/20/22 1851)  LORazepam (ATIVAN) tablet 0.5 mg (0.5 mg Oral Given 03/20/22 2016)  sodium chloride 0.9 % bolus 1,000 mL (1,000 mLs Intravenous New Bag/Given 03/20/22 2027)  morphine (PF) 4 MG/ML injection 4 mg (4 mg Intravenous Given 03/20/22 2031)  gadobutrol (GADAVIST) 1 MMOL/ML injection 7.5 mL (7.5 mLs Intravenous Contrast Given 03/20/22 2125)    ED Course/ Medical Decision Making/ A&P                             Medical Decision Making Amount and/or Complexity of Data Reviewed Labs: ordered. Radiology: ordered.  Risk Prescription drug management.   32 y.o. female presents to the ED for concern of Leg Pain   This involves an extensive number of treatment options, and is a complaint that carries with it a high risk of complications and morbidity.  The emergent differential diagnosis prior to evaluation includes, but is not limited to:  Muscle spasm, fracture, dislocation, contusion, cellulitis, rash, lymphadenopathy, lymphadenitis  This is not an exhaustive differential.   Past Medical History / Co-morbidities / Social History: Hx of abnormal uterine bleeding, umbilical hernia Social Determinants of Health include: No PCP, resources provided.  Additional History:  Obtained by chart review.  Notably OBGYN, General surgery, and prior ED visits.  See for details.  Lab Tests: I ordered, and personally interpreted labs.  The pertinent results include:  Sodium 134, glucose 112, no AKI or abnormal LFTs WBC 14.7 with leukocytosis.  No anemia. Negative i-STAT beta-hCG CK 92 ESR 17 CRP 6.7 Lactic acid 1.0 Blood cultures pending  Imaging Studies: I ordered imaging studies including US Doppler RLE .   I independently visualized and interpreted imaging which showed stable severely enlarged inguinal lymph nodes I agree with the radiologist interpretation.  ED Course: Pt well-appearing on exam.  HDS in NAD.  Sitting comfortably.  Lower extremities appear grossly neurovascularly intact.  Compartments soft.  Doubt ischemia.  Concerning inability to weight bear and with reduced ROM due to elicited pain.  Plan to further evaluate with [place] of RLE to assess for DVT vs lymphadenopathy. Ultrasound negative for DVT, however does indicate enlarged lymphadenopathy.  Further assessment with CT imaging indicates abnormal right hip joint effusion, was not able to rule out septic arthritis.  Recommended MRI imaging for further evaluation.  Pain managed in the ED, some relief with morphine.   Elevated WBC of 14.7, though unremarkable lactic acid, CK, and ESR.  Mildly elevated CRP though nonspecific.  Remaining labs unremarkable. 2100 MRI still pending.  Disposition: 2130 care of Raymond Gurney transferred to [facility] PA-C at the end of my shift as the patient will require reassessment once labs/imaging have resulted.  Patient  presentation, ED course, and plan of care discussed with review of all pertinent labs and imaging.  Please see his/her note for further details regarding further ED course and disposition.   Plan at time of handoff is pending MRI reading.  Suspicious for possible septic joint though lactic acid and ESR are not elevated.  If indicative of septic joint, plant to consult orthopedics and treat accordingly.  If not, may require further workup and consultation with medicine/orthopedics and/or for pain management.  This may be altered or completely changed at the discretion of the oncoming team pending results of further workup.  I discussed this case with my attending physician Dr. Doren Custard.  Attending physician stated agreement with plan or made changes to plan which were implemented.    This chart was dictated using voice recognition software.  Despite best efforts to proofread, errors can occur which can change the documentation meaning.         Final Clinical Impression(s) / ED Diagnoses Final diagnoses:  Acute pain of right thigh    Rx / DC Orders ED Discharge Orders     None         Candace Cruise [id number]    Godfrey Pick, MD 03/22/22 [id number]

## 2022-03-20 NOTE — ED Triage Notes (Signed)
Patient here for evaluation of right leg pain that started Friday. Pain is on anterior and medial thigh, pain described as sharp and stabbing. Denies recent injury. Patient is alert, oriented, and in no apparent distress at this time.

## 2022-03-20 NOTE — Care Management (Signed)
PCP on AVS

## 2022-03-20 NOTE — ED Notes (Signed)
Assisted taking pt off bedpan as her RN was busy. Patient repositioned and settled.

## 2022-03-20 NOTE — Progress Notes (Signed)
VASCULAR LAB    Right lower extremity venous duplex has been performed.  See CV proc for preliminary results.  Messaged results to Olen Pel, PA-C    Hays Dunnigan, [place], RVT 03/20/2022, 12:34 PM

## 2022-03-20 NOTE — ED Provider Notes (Signed)
Accepted handoff at shift change from [facility] PA-C. Please see prior provider note for more detail.   Briefly: Patient is 32 y.o.   DDX: concern for septic hip  Plan: Follow-up on MRI     Physical Exam  BP 121/70   Pulse 96   Temp 98.6 F (37 C) (Oral)   Resp 19   Ht 5' 2"$  (1.575 m)   Wt 74.8 kg   LMP  (LMP Unknown)   SpO2 100%   BMI 30.18 kg/m   Physical Exam Constitutional:      Appearance: She is obese.  Abdominal:     Tenderness: There is no abdominal tenderness.  Musculoskeletal:     Comments: Range of motion of right hip limited secondary to significant discomfort.  DP PT pulses 3+ and symmetric sensation intact in bilateral lower extremities     Procedures  .Critical Care  Performed by: Tedd Sias, PA Authorized by: Tedd Sias, PA   Critical care provider statement:    Critical care time (minutes):  35   Critical care time was exclusive of:  Separately billable procedures and treating other patients and teaching time   Critical care was necessary to treat or prevent imminent or life-threatening deterioration of the following conditions: septic joint.   Critical care was time spent personally by me on the following activities:  Development of treatment plan with patient or surrogate, review of old charts, re-evaluation of patient's condition, pulse oximetry, ordering and review of radiographic studies, ordering and review of laboratory studies, ordering and performing treatments and interventions, obtaining history from patient or surrogate, examination of patient and evaluation of patient's response to treatment   Care discussed with: admitting provider    Results for orders placed or performed during the hospital encounter of 03/20/22  CBC with Differential  Result Value Ref Range   WBC 14.7 (H) 4.0 - 10.5 K/uL   RBC 4.04 3.87 - 5.11 MIL/uL   Hemoglobin 13.1 12.0 - 15.0 g/dL   HCT 39.4 36.0 - 46.0 %   MCV 97.5 80.0 - 100.0 fL   MCH 32.4 26.0 - 34.0  pg   MCHC 33.2 30.0 - 36.0 g/dL   RDW 13.8 11.5 - 15.5 %   Platelets 161 150 - 400 K/uL   nRBC 0.0 0.0 - 0.2 %   Neutrophils Relative % 77 %   Neutro Abs 11.3 (H) 1.7 - 7.7 K/uL   Lymphocytes Relative 15 %   Lymphs Abs 2.2 0.7 - 4.0 K/uL   Monocytes Relative 8 %   Monocytes Absolute 1.1 (H) 0.1 - 1.0 K/uL   Eosinophils Relative 0 %   Eosinophils Absolute 0.0 0.0 - 0.5 K/uL   Basophils Relative 0 %   Basophils Absolute 0.0 0.0 - 0.1 K/uL   Immature Granulocytes 0 %   Abs Immature Granulocytes 0.04 0.00 - 0.07 K/uL  Comprehensive metabolic panel  Result Value Ref Range   Sodium 134 (L) 135 - 145 mmol/L   Potassium 3.6 3.5 - 5.1 mmol/L   Chloride 99 98 - 111 mmol/L   CO2 26 22 - 32 mmol/L   Glucose, Bld 112 (H) 70 - 99 mg/dL   BUN 10 6 - 20 mg/dL   Creatinine, Ser 0.69 0.44 - 1.00 mg/dL   Calcium 9.3 8.9 - 10.3 mg/dL   Total Protein 7.4 6.5 - 8.1 g/dL   Albumin 3.7 3.5 - 5.0 g/dL   AST 21 15 - 41 U/L   ALT 17 0 -  44 U/L   Alkaline Phosphatase 43 38 - 126 U/L   Total Bilirubin 0.5 0.3 - 1.2 mg/dL   GFR, Estimated >60 >60 mL/min   Anion gap 9 5 - 15  CK  Result Value Ref Range   Total CK 92 38 - 234 U/L  Sedimentation rate  Result Value Ref Range   Sed Rate 17 0 - 22 mm/hr  C-reactive protein  Result Value Ref Range   CRP 6.7 (H) <1.0 mg/dL  Lactic acid, plasma  Result Value Ref Range   Lactic Acid, Venous 1.0 0.5 - 1.9 mmol/L  I-Stat beta hCG blood, ED  Result Value Ref Range   I-stat hCG, quantitative <5.0 <5 mIU/mL   Comment 3           MR HIP RIGHT W WO CONTRAST  Result Date: 03/20/2022 CLINICAL DATA:  Right hip joint effusion on CT examination performed earlier on the same date. Hip pain, septic arthritis suspected EXAM: MRI OF THE RIGHT HIP WITHOUT AND WITH CONTRAST TECHNIQUE: Multiplanar, multisequence MR imaging was performed both before and after administration of intravenous contrast. CONTRAST:  7.19m GADAVIST GADOBUTROL 1 MMOL/ML IV SOLN COMPARISON:  CT  examination dated March 20, 2018 FINDINGS: Bones/right hip joint/surrounding soft tissues: Marrow signal is within normal limits. There is a large right hip joint effusion with marked surrounding edema and inflammatory changes involving the surrounding muscles predominantly involving the adductor longus and brevis muscles as well as edema and inflammatory changes of the vastus intermedius muscles. There is also mild edema of the gluteus medius and minimus muscles about its insertion. Articular cartilage and labrum Articular cartilage:  Intact Labrum:  Intact Muscles and tendons Muscles and tendons: Marked edema of the muscles about the right hip joint predominantly involving the adductor muscles as well as gluteus medius/minimus and vastus intermedius muscle. There is a peripherally enhancing fluid collection about the adductor muscles measuring at least 1.6 x 3.0 cm, this collection appears to be connected to the right hip joint. Other findings Miscellaneous: Left hip joint is unremarkable. No acute abdominal/pelvic process IMPRESSION: 1. Large right hip joint effusion with marked surrounding edema and inflammatory changes involving the adductor muscles as well as the vastus intermedius muscle and to a lesser extent the gluteus medius and minimus muscles. Findings are concerning for septic arthritis. 2. There is a peripherally enhancing fluid collection about the adductor muscles measuring at least 1.6 x 3.0 cm, this collection appears to be connected to the right hip joint. 3. Marrow signal is within normal limits without evidence of osteomyelitis. Right hip septic arthritis with surrounding inflammatory changes predominantly involving the adductor muscles. Immediate orthopedic consultation for further management is recommended. Above findings and recommendations were called by telephone at the time of interpretation on 03/20/2022 at 10:57 pm to provider PA WHeide Guile, who verbally acknowledged these results.  Electronically Signed   By: IKeane PoliceD.O.   On: 03/20/2022 22:59   CT Abdomen Pelvis W Contrast  Result Date: 03/20/2022 CLINICAL DATA:  Right groin adenopathy. EXAM: CT ABDOMEN AND PELVIS WITH CONTRAST TECHNIQUE: Multidetector CT imaging of the abdomen and pelvis was performed using the standard protocol following bolus administration of intravenous contrast. RADIATION DOSE REDUCTION: This exam was performed according to the departmental dose-optimization program which includes automated exposure control, adjustment of the mA and/or kV according to patient size and/or use of iterative reconstruction technique. CONTRAST:  729mOMNIPAQUE IOHEXOL 350 MG/ML SOLN COMPARISON:  None Available. FINDINGS: Lower chest: The lung  bases are clear of acute process. No pleural effusion or pulmonary lesions. The heart is normal in size. No pericardial effusion. The distal esophagus and aorta are unremarkable. Hepatobiliary: No focal hepatic lesions or intrahepatic biliary dilatation. The gallbladder is normal. No common bile duct dilatation. Pancreas: Normal Spleen: Normal Adrenals/Urinary Tract: Normal Stomach/Bowel: The stomach, duodenum, small bowel and colon are unremarkable. The terminal ileum and appendix are. Vascular/Lymphatic: The aorta is normal in caliber. No dissection. The branch vessels are patent. The major venous structures are patent. No mesenteric or retroperitoneal mass or adenopathy. Small scattered lymph nodes are noted. Reproductive: The uterus and ovaries are unremarkable. Other: No pelvic mass or adenopathy. Small amount of free pelvic fluid, likely physiologic. No inguinal mass or adenopathy. No abdominal wall hernia or subcutaneous lesions. Small right inguinal lymph nodes but no overt lymphadenopathy. Musculoskeletal: There is a moderate to large right hip joint effusion. I do not see any destructive bony changes to suggest septic arthritis but recommend correlation with clinical findings such as  white count, fever and sed rate. No evidence of AVN. The pubic symphysis and SI joints are intact. IMPRESSION: 1. No acute abdominal/pelvic findings, mass lesions or adenopathy. 2. Small amount of free pelvic fluid, likely physiologic. 3. Abnormal right hip joint effusion. Could not exclude septic arthritis. Recommend clinical correlation. MRI right hip without and with contrast may be helpful for further evaluation. Electronically Signed   By: Marijo Sanes M.D.   On: 03/20/2022 16:31   VAS [place] LOWER EXTREMITY VENOUS (DVT) (7a-7p)  Result Date: 03/20/2022  Lower Venous DVT Study Patient Name:  Kristen Robles  Date of Exam:   03/20/2022 Medical Rec #: [id number]           Accession #:    [id number] Date of Birth: 12-Feb-1990           Patient Gender: F Patient Age:   6 years Exam Location:  [facility] Procedure:      VAS [place] LOWER EXTREMITY VENOUS (DVT) Referring Phys: Dorise Bullion --------------------------------------------------------------------------------  Indications: Thigh pain.  Comparison Study: No prior study on file Performing Technologist: Sharion Dove RVS  Examination Guidelines: A complete evaluation includes B-mode imaging, spectral Doppler, color Doppler, and power Doppler as needed of all accessible portions of each vessel. Bilateral testing is considered an integral part of a complete examination. Limited examinations for reoccurring indications may be performed as noted. The reflux portion of the exam is performed with the patient in reverse Trendelenburg.  +---------+---------------+---------+-----------+--------------+--------------+ RIGHT    CompressibilityPhasicitySpontaneityProperties    Thrombus Aging +---------+---------------+---------+-----------+--------------+--------------+ CFV      Full                               pulsatile flow               +---------+---------------+---------+-----------+--------------+--------------+ SFJ      Full                                                             +---------+---------------+---------+-----------+--------------+--------------+ FV Prox  Full                               pulsatile flow               +---------+---------------+---------+-----------+--------------+--------------+  FV Mid   Full                                                            +---------+---------------+---------+-----------+--------------+--------------+ FV DistalFull                                                            +---------+---------------+---------+-----------+--------------+--------------+ PFV      Full                               pulsatile flow               +---------+---------------+---------+-----------+--------------+--------------+ POP      Full                                                            +---------+---------------+---------+-----------+--------------+--------------+ PTV      Full                                                            +---------+---------------+---------+-----------+--------------+--------------+ PERO     Full                                                            +---------+---------------+---------+-----------+--------------+--------------+   +----+---------------+---------+-----------+--------------+--------------+ LEFTCompressibilityPhasicitySpontaneityProperties    Thrombus Aging +----+---------------+---------+-----------+--------------+--------------+ CFV Full                               pulsatile flow               +----+---------------+---------+-----------+--------------+--------------+     Summary: RIGHT: - There is no evidence of deep vein thrombosis in the lower extremity.  - No cystic structure found in the popliteal fossa. - Ultrasound characteristics of enlarged lymph nodes are noted in the groin. pulsatile flow noted  LEFT: - No evidence of common femoral vein obstruction. Pulsatile flow noted.   *See table(s) above for measurements and observations. Electronically signed by Deitra Mayo MD on 03/20/2022 at 3:39:46 PM.    Final     ED Course / MDM    Medical Decision Making Amount and/or Complexity of Data Reviewed Labs: ordered. Radiology: ordered.  Risk Prescription drug management.   MRI w evidence of septic hip IMPRESSION:  1. Large right hip joint effusion with marked surrounding edema and  inflammatory changes involving the adductor muscles as well as the  vastus intermedius muscle and to a lesser extent the gluteus medius  and minimus muscles. Findings are  concerning for septic arthritis.  2. There is a peripherally enhancing fluid collection about the  adductor muscles measuring at least 1.6 x 3.0 cm, this collection  appears to be connected to the right hip joint.  3. Marrow signal is within normal limits without evidence of  osteomyelitis.    Right hip septic arthritis with surrounding inflammatory changes  predominantly involving the adductor muscles. Immediate orthopedic  consultation for further management is recommended. Above findings  and recommendations were called by telephone at the time of  interpretation on 03/20/2022 at 10:57 pm to provider PA Heide Guile , who  verbally acknowledged these results.      Electronically Signed    By: Keane Police D.O.    On: 03/20/2022 22:59   Last PO ~5pm  Initiated vancomycin and Zosyn as broad-spectrum empiric antibiotics for septic joint.  Discussed with ortho Chrys Racer APP - will see in consultation (she will talk to IR).  Admit to medicine.      Pati Gallo [place], [place] 03/20/22 [id number]    Godfrey Pick, MD 03/22/22 [phone]

## 2022-03-21 ENCOUNTER — Inpatient Hospital Stay (HOSPITAL_COMMUNITY): Payer: Medicaid Other

## 2022-03-21 DIAGNOSIS — D649 Anemia, unspecified: Secondary | ICD-10-CM | POA: Diagnosis not present

## 2022-03-21 DIAGNOSIS — Z885 Allergy status to narcotic agent status: Secondary | ICD-10-CM | POA: Diagnosis not present

## 2022-03-21 DIAGNOSIS — M00851 Arthritis due to other bacteria, right hip: Secondary | ICD-10-CM | POA: Diagnosis not present

## 2022-03-21 DIAGNOSIS — A419 Sepsis, unspecified organism: Secondary | ICD-10-CM | POA: Diagnosis not present

## 2022-03-21 DIAGNOSIS — E876 Hypokalemia: Secondary | ICD-10-CM | POA: Diagnosis present

## 2022-03-21 DIAGNOSIS — L02415 Cutaneous abscess of right lower limb: Secondary | ICD-10-CM | POA: Diagnosis present

## 2022-03-21 DIAGNOSIS — K219 Gastro-esophageal reflux disease without esophagitis: Secondary | ICD-10-CM | POA: Diagnosis present

## 2022-03-21 DIAGNOSIS — M009 Pyogenic arthritis, unspecified: Secondary | ICD-10-CM

## 2022-03-21 DIAGNOSIS — M79651 Pain in right thigh: Secondary | ICD-10-CM | POA: Diagnosis not present

## 2022-03-21 DIAGNOSIS — N939 Abnormal uterine and vaginal bleeding, unspecified: Secondary | ICD-10-CM | POA: Diagnosis present

## 2022-03-21 LAB — CBC
HCT: 36.6 % (ref 36.0–46.0)
Hemoglobin: 12.1 g/dL (ref 12.0–15.0)
MCH: 33.1 pg (ref 26.0–34.0)
MCHC: 33.1 g/dL (ref 30.0–36.0)
MCV: 100 fL (ref 80.0–100.0)
Platelets: 152 10*3/uL (ref 150–400)
RBC: 3.66 MIL/uL — ABNORMAL LOW (ref 3.87–5.11)
RDW: 13.6 % (ref 11.5–15.5)
WBC: 13.2 10*3/uL — ABNORMAL HIGH (ref 4.0–10.5)
nRBC: 0 % (ref 0.0–0.2)

## 2022-03-21 LAB — HIV ANTIBODY (ROUTINE TESTING W REFLEX): HIV Screen 4th Generation wRfx: NONREACTIVE

## 2022-03-21 LAB — BASIC METABOLIC PANEL
Anion gap: 10 (ref 5–15)
BUN: 8 mg/dL (ref 6–20)
CO2: 24 mmol/L (ref 22–32)
Calcium: 8.8 mg/dL — ABNORMAL LOW (ref 8.9–10.3)
Chloride: 101 mmol/L (ref 98–111)
Creatinine, Ser: 0.79 mg/dL (ref 0.44–1.00)
GFR, Estimated: >60 mL/min (ref >60)
Glucose, Bld: 137 mg/dL — ABNORMAL HIGH (ref 70–99)
Potassium: 3.4 mmol/L — ABNORMAL LOW (ref 3.5–5.1)
Sodium: 135 mmol/L (ref 135–145)

## 2022-03-21 LAB — LACTIC ACID, PLASMA: Lactic Acid, Venous: 0.8 mmol/L (ref 0.5–1.9)

## 2022-03-21 LAB — MAGNESIUM: Magnesium: 2.1 mg/dL (ref 1.7–2.4)

## 2022-03-21 MED ORDER — ACETAMINOPHEN 650 MG RE SUPP: 650.0000 mg | Freq: Four times a day (QID) | RECTAL | Status: DC | PRN

## 2022-03-21 MED ORDER — MORPHINE SULFATE (PF) 2 MG/ML IV SOLN
2.0000 mg | INTRAVENOUS | Status: DC | PRN
  Administered 2022-03-21 (×4): 4 mg via INTRAVENOUS
  Administered 2022-03-21: 2 mg via INTRAVENOUS
  Administered 2022-03-22 (×3): 4 mg via INTRAVENOUS
  Filled 2022-03-21 (×7): qty 2
  Filled 2022-03-21: qty 1

## 2022-03-21 MED ORDER — POTASSIUM CHLORIDE CRYS ER 20 MEQ PO TBCR
30.0000 meq | EXTENDED_RELEASE_TABLET | Freq: Once | ORAL | Status: AC
  Administered 2022-03-21: 30 meq via ORAL
  Filled 2022-03-21: qty 1

## 2022-03-21 MED ORDER — POVIDONE-IODINE 10 % EX SWAB: 2.0000 | Freq: Once | CUTANEOUS | Status: DC

## 2022-03-21 MED ORDER — VANCOMYCIN HCL IN DEXTROSE 1-5 GM/200ML-% IV SOLN
1000.0000 mg | Freq: Two times a day (BID) | INTRAVENOUS | Status: DC
  Administered 2022-03-21 – 2022-03-27 (×14): 1000 mg via INTRAVENOUS
  Filled 2022-03-21 (×15): qty 200

## 2022-03-21 MED ORDER — LIDOCAINE HCL (PF) 1 % IJ SOLN
10.0000 mL | Freq: Once | INTRAMUSCULAR | Status: AC
  Administered 2022-03-21: 10 mL via INTRADERMAL

## 2022-03-21 MED ORDER — PIPERACILLIN-TAZOBACTAM 3.375 G IVPB
3.3750 g | Freq: Three times a day (TID) | INTRAVENOUS | Status: DC
  Administered 2022-03-21 – 2022-03-23 (×5): 3.375 g via INTRAVENOUS
  Filled 2022-03-21 (×6): qty 50

## 2022-03-21 MED ORDER — ACETAMINOPHEN 500 MG PO TABS
1000.0000 mg | ORAL_TABLET | Freq: Once | ORAL | Status: AC
  Administered 2022-03-22: 1000 mg via ORAL
  Filled 2022-03-21: qty 2

## 2022-03-21 MED ORDER — ONDANSETRON HCL 4 MG PO TABS: 4.0000 mg | ORAL_TABLET | Freq: Four times a day (QID) | ORAL | Status: DC | PRN

## 2022-03-21 MED ORDER — ENOXAPARIN SODIUM 40 MG/0.4ML IJ SOSY
40.0000 mg | PREFILLED_SYRINGE | INTRAMUSCULAR | Status: AC
  Administered 2022-03-21: 40 mg via SUBCUTANEOUS
  Filled 2022-03-21: qty 0.4

## 2022-03-21 MED ORDER — CEFAZOLIN SODIUM-DEXTROSE 2-4 GM/100ML-% IV SOLN
2.0000 g | INTRAVENOUS | Status: AC
  Administered 2022-03-22: 2 g via INTRAVENOUS
  Filled 2022-03-21: qty 100

## 2022-03-21 MED ORDER — CHLORHEXIDINE GLUCONATE 4 % EX LIQD
60.0000 mL | Freq: Once | CUTANEOUS | Status: AC
  Administered 2022-03-22: 4 via TOPICAL
  Filled 2022-03-21: qty 15

## 2022-03-21 MED ORDER — ENOXAPARIN SODIUM 40 MG/0.4ML IJ SOSY: 40.0000 mg | PREFILLED_SYRINGE | INTRAMUSCULAR | Status: DC

## 2022-03-21 MED ORDER — ONDANSETRON HCL 4 MG/2ML IJ SOLN: 4.0000 mg | Freq: Four times a day (QID) | INTRAMUSCULAR | Status: DC | PRN

## 2022-03-21 MED ORDER — DEXAMETHASONE SODIUM PHOSPHATE 10 MG/ML IJ SOLN
8.0000 mg | Freq: Once | INTRAMUSCULAR | Status: AC
  Administered 2022-03-22: 8 mg via INTRAVENOUS
  Filled 2022-03-21: qty 1

## 2022-03-21 MED ORDER — ACETAMINOPHEN 325 MG PO TABS
650.0000 mg | ORAL_TABLET | Freq: Four times a day (QID) | ORAL | Status: DC | PRN
  Administered 2022-03-22: 650 mg via ORAL
  Filled 2022-03-21: qty 2

## 2022-03-21 NOTE — Consult Note (Signed)
Reason for Consult:Right hip effusion Referring Physician: Lorella Nimrod Time called: [id number] Time at bedside: 0901   Kristen Robles is an 32 y.o. female.  HPI: Kristen Robles developed right hip pain on Friday. It came on gradually and there was no antecedent event though she did move some things earlier that day. By the weekend she was limping. She came to the ED yesterday and MRI showed a large hip effusion. She was admitted and orthopedic surgery was consulted. She denies prior hx/o similar. She did have some chills and sweats over the weekend.  History reviewed. No pertinent past medical history.  History reviewed. No pertinent surgical history.  History reviewed. No pertinent family history.  Social History:  reports that she has never smoked. She does not have any smokeless tobacco history on file. She reports current alcohol use of about 1.0 standard drink of alcohol per week. She reports that she does not use drugs.  Allergies:  Allergies  Allergen Reactions   Oxycodone-Acetaminophen Itching and Rash   Tomato Anaphylaxis and Hives    Medications: I have reviewed the patient's current medications.  Results for orders placed or performed during the hospital encounter of 03/20/22 (from the past 48 hour(s))  CBC with Differential     Status: Abnormal   Collection Time: 03/20/22 12:42 PM  Result Value Ref Range   WBC 14.7 (H) 4.0 - 10.5 K/uL   RBC 4.04 3.87 - 5.11 MIL/uL   Hemoglobin 13.1 12.0 - 15.0 g/dL   HCT 39.4 36.0 - 46.0 %   MCV 97.5 80.0 - 100.0 fL   MCH 32.4 26.0 - 34.0 pg   MCHC 33.2 30.0 - 36.0 g/dL   RDW 13.8 11.5 - 15.5 %   Platelets 161 150 - 400 K/uL   nRBC 0.0 0.0 - 0.2 %   Neutrophils Relative % 77 %   Neutro Abs 11.3 (H) 1.7 - 7.7 K/uL   Lymphocytes Relative 15 %   Lymphs Abs 2.2 0.7 - 4.0 K/uL   Monocytes Relative 8 %   Monocytes Absolute 1.1 (H) 0.1 - 1.0 K/uL   Eosinophils Relative 0 %   Eosinophils Absolute 0.0 0.0 - 0.5 K/uL   Basophils Relative 0  %   Basophils Absolute 0.0 0.0 - 0.1 K/uL   Immature Granulocytes 0 %   Abs Immature Granulocytes 0.04 0.00 - 0.07 K/uL    Comment: Performed at [facility], [address] [place], [place] [address]  Comprehensive metabolic panel     Status: Abnormal   Collection Time: 03/20/22 12:42 PM  Result Value Ref Range   Sodium 134 (L) 135 - 145 mmol/L   Potassium 3.6 3.5 - 5.1 mmol/L   Chloride 99 98 - 111 mmol/L   CO2 26 22 - 32 mmol/L   Glucose, Bld 112 (H) 70 - 99 mg/dL    Comment: Glucose reference range applies only to samples taken after fasting for at least 8 hours.   BUN 10 6 - 20 mg/dL   Creatinine, Ser 0.69 0.44 - 1.00 mg/dL   Calcium 9.3 8.9 - 10.3 mg/dL   Total Protein 7.4 6.5 - 8.1 g/dL   Albumin 3.7 3.5 - 5.0 g/dL   AST 21 15 - 41 U/L   ALT 17 0 - 44 U/L   Alkaline Phosphatase 43 38 - 126 U/L   Total Bilirubin 0.5 0.3 - 1.2 mg/dL   GFR, Estimated >60 >60 mL/min    Comment: (NOTE) Calculated using the CKD-EPI Creatinine Equation (  2021)    Anion gap 9 5 - 15    Comment: Performed at [facility], [place] [address] [place], [place] [address]  CK     Status: None   Collection Time: 03/20/22 12:42 PM  Result Value Ref Range   Total CK 92 38 - 234 U/L    Comment: Performed at [facility], [place] [address] [place], [place] [address]  I-Stat beta hCG blood, ED     Status: None   Collection Time: 03/20/22  3:29 PM  Result Value Ref Range   I-stat hCG, quantitative <5.0 <5 mIU/mL   Comment 3            Comment:   GEST. AGE      CONC.  (mIU/mL)   <=1 WEEK        5 - 50     2 WEEKS       50 - 500     3 WEEKS       100 - 10,000     4 WEEKS     1,000 - 30,000        FEMALE AND NON-PREGNANT FEMALE:     LESS THAN 5 mIU/mL   Sedimentation rate     Status: None   Collection Time: 03/20/22  8:07 PM  Result Value Ref Range   Sed Rate 17 0 - 22 mm/hr    Comment: Performed at [facility], [place] [address] [place], [place] [address]  C-reactive protein      Status: Abnormal   Collection Time: 03/20/22  8:07 PM  Result Value Ref Range   CRP 6.7 (H) <1.0 mg/dL    Comment: Performed at [facility], [place] [address] [place], [place] [address]  Culture, blood (routine x 2)     Status: None (Preliminary result)   Collection Time: 03/20/22  8:38 PM   Specimen: BLOOD  Result Value Ref Range   Specimen Description BLOOD LEFT ANTECUBITAL    Special Requests      BOTTLES DRAWN AEROBIC AND ANAEROBIC Blood Culture adequate volume   Culture      NO GROWTH < 12 HOURS Performed at [facility], [place] [address], [place], [place] [address]    Report Status PENDING   Lactic acid, plasma     Status: None   Collection Time: 03/20/22  8:38 PM  Result Value Ref Range   Lactic Acid, Venous 1.0 0.5 - 1.9 mmol/L    Comment: Performed at [facility] [address], [place], [place] [address]  Lactic acid, plasma     Status: None   Collection Time: 03/21/22  4:59 AM  Result Value Ref Range   Lactic Acid, Venous 0.8 0.5 - 1.9 mmol/L    Comment: Performed at [facility] [address] [place], [place] [address]  CBC     Status: Abnormal   Collection Time: 03/21/22  4:59 AM  Result Value Ref Range   WBC 13.2 (H) 4.0 - 10.5 K/uL   RBC 3.66 (L) 3.87 - 5.11 MIL/uL   Hemoglobin 12.1 12.0 - 15.0 g/dL   HCT 36.6 36.0 - 46.0 %   MCV 100.0 80.0 - 100.0 fL   MCH 33.1 26.0 - 34.0 pg   MCHC 33.1 30.0 - 36.0 g/dL   RDW 13.6 11.5 - 15.5 %   Platelets 152 150 - 400 K/uL   nRBC 0.0 0.0 - 0.2 %  Comment: Performed at [facility], [place] [address], [place], [place] [id number]  Basic metabolic panel     Status: Abnormal   Collection Time: 03/21/22  4:59 AM  Result Value Ref Range   Sodium 135 135 - 145 mmol/L   Potassium 3.4 (L) 3.5 - 5.1 mmol/L   Chloride 101 98 - 111 mmol/L   CO2 24 22 - 32 mmol/L   Glucose, Bld 137 (H) 70 - 99 mg/dL    Comment: Glucose reference range applies only to samples taken after fasting for at least 8 hours.    BUN 8 6 - 20 mg/dL   Creatinine, Ser 0.79 0.44 - 1.00 mg/dL   Calcium 8.8 (L) 8.9 - 10.3 mg/dL   GFR, Estimated >60 >60 mL/min    Comment: (NOTE) Calculated using the CKD-EPI Creatinine Equation (2021)    Anion gap 10 5 - 15    Comment: Performed at [facility] [address], [place], [place] [address]  Magnesium     Status: None   Collection Time: 03/21/22  4:59 AM  Result Value Ref Range   Magnesium 2.1 1.7 - 2.4 mg/dL    Comment: Performed at [facility] [address], [place], [place] [address]    MR HIP RIGHT W WO CONTRAST  Result Date: 03/20/2022 CLINICAL DATA:  Right hip joint effusion on CT examination performed earlier on the same date. Hip pain, septic arthritis suspected EXAM: MRI OF THE RIGHT HIP WITHOUT AND WITH CONTRAST TECHNIQUE: Multiplanar, multisequence MR imaging was performed both before and after administration of intravenous contrast. CONTRAST:  7.82m GADAVIST GADOBUTROL 1 MMOL/ML IV SOLN COMPARISON:  CT examination dated March 20, 2018 FINDINGS: Bones/right hip joint/surrounding soft tissues: Marrow signal is within normal limits. There is a large right hip joint effusion with marked surrounding edema and inflammatory changes involving the surrounding muscles predominantly involving the adductor longus and brevis muscles as well as edema and inflammatory changes of the vastus intermedius muscles. There is also mild edema of the gluteus medius and minimus muscles about its insertion. Articular cartilage and labrum Articular cartilage:  Intact Labrum:  Intact Muscles and tendons Muscles and tendons: Marked edema of the muscles about the right hip joint predominantly involving the adductor muscles as well as gluteus medius/minimus and vastus intermedius muscle. There is a peripherally enhancing fluid collection about the adductor muscles measuring at least 1.6 x 3.0 cm, this collection appears to be connected to the right hip joint. Other findings  Miscellaneous: Left hip joint is unremarkable. No acute abdominal/pelvic process IMPRESSION: 1. Large right hip joint effusion with marked surrounding edema and inflammatory changes involving the adductor muscles as well as the vastus intermedius muscle and to a lesser extent the gluteus medius and minimus muscles. Findings are concerning for septic arthritis. 2. There is a peripherally enhancing fluid collection about the adductor muscles measuring at least 1.6 x 3.0 cm, this collection appears to be connected to the right hip joint. 3. Marrow signal is within normal limits without evidence of osteomyelitis. Right hip septic arthritis with surrounding inflammatory changes predominantly involving the adductor muscles. Immediate orthopedic consultation for further management is recommended. Above findings and recommendations were called by telephone at the time of interpretation on 03/20/2022 at 10:57 pm to provider PA WHeide Guile, who verbally acknowledged these results. Electronically Signed   By: IKeane PoliceD.O.   On: 03/20/2022 22:59   CT Abdomen Pelvis W Contrast  Result Date: 03/20/2022 CLINICAL  DATA:  Right groin adenopathy. EXAM: CT ABDOMEN AND PELVIS WITH CONTRAST TECHNIQUE: Multidetector CT imaging of the abdomen and pelvis was performed using the standard protocol following bolus administration of intravenous contrast. RADIATION DOSE REDUCTION: This exam was performed according to the departmental dose-optimization program which includes automated exposure control, adjustment of the mA and/or kV according to patient size and/or use of iterative reconstruction technique. CONTRAST:  13m OMNIPAQUE IOHEXOL 350 MG/ML SOLN COMPARISON:  None Available. FINDINGS: Lower chest: The lung bases are clear of acute process. No pleural effusion or pulmonary lesions. The heart is normal in size. No pericardial effusion. The distal esophagus and aorta are unremarkable. Hepatobiliary: No focal hepatic lesions or  intrahepatic biliary dilatation. The gallbladder is normal. No common bile duct dilatation. Pancreas: Normal Spleen: Normal Adrenals/Urinary Tract: Normal Stomach/Bowel: The stomach, duodenum, small bowel and colon are unremarkable. The terminal ileum and appendix are. Vascular/Lymphatic: The aorta is normal in caliber. No dissection. The branch vessels are patent. The major venous structures are patent. No mesenteric or retroperitoneal mass or adenopathy. Small scattered lymph nodes are noted. Reproductive: The uterus and ovaries are unremarkable. Other: No pelvic mass or adenopathy. Small amount of free pelvic fluid, likely physiologic. No inguinal mass or adenopathy. No abdominal wall hernia or subcutaneous lesions. Small right inguinal lymph nodes but no overt lymphadenopathy. Musculoskeletal: There is a moderate to large right hip joint effusion. I do not see any destructive bony changes to suggest septic arthritis but recommend correlation with clinical findings such as white count, fever and sed rate. No evidence of AVN. The pubic symphysis and SI joints are intact. IMPRESSION: 1. No acute abdominal/pelvic findings, mass lesions or adenopathy. 2. Small amount of free pelvic fluid, likely physiologic. 3. Abnormal right hip joint effusion. Could not exclude septic arthritis. Recommend clinical correlation. MRI right hip without and with contrast may be helpful for further evaluation. Electronically Signed   By: PMarijo SanesM.D.   On: 03/20/2022 16:31   VAS [place]OWER EXTREMITY VENOUS (DVT) (7a-7p)  Result Date: 03/20/2022  Lower Venous DVT Study Patient Name:  ZNICKELLE BARMAN Date of Exam:   03/20/2022 Medical Rec #: [id number]          Accession #:    [id number] of Birth: [birth year]          Patient Gender: F Patient Age:   89years [facility]      V[facility] [place]OWER EXTREMITY VENOUS (DVT) Referring Phys: ADorise Bullion --------------------------------------------------------------------------------  Indications: Thigh pain.  Comparison Study: No prior study on file Performing Technologist: CSharion DoveRVS  Examination Guidelines: A complete evaluation includes B-mode imaging, spectral Doppler, color Doppler, and power Doppler as needed of all accessible portions of each vessel. Bilateral testing is considered an integral part of a complete examination. Limited examinations for reoccurring indications may be performed as noted. The reflux portion of the exam is performed with the patient in reverse Trendelenburg.  +---------+---------------+---------+-----------+--------------+--------------+ RIGHT    CompressibilityPhasicitySpontaneityProperties    Thrombus Aging +---------+---------------+---------+-----------+--------------+--------------+ CFV      Full                               pulsatile flow               +---------+---------------+---------+-----------+--------------+--------------+ SFJ      Full                                                            +---------+---------------+---------+-----------+--------------+--------------+  FV Prox  Full                               pulsatile flow               +---------+---------------+---------+-----------+--------------+--------------+ FV Mid   Full                                                            +---------+---------------+---------+-----------+--------------+--------------+ FV DistalFull                                                            +---------+---------------+---------+-----------+--------------+--------------+ PFV      Full                               pulsatile flow               +---------+---------------+---------+-----------+--------------+--------------+ POP      Full                                                             +---------+---------------+---------+-----------+--------------+--------------+ PTV      Full                                                            +---------+---------------+---------+-----------+--------------+--------------+ PERO     Full                                                            +---------+---------------+---------+-----------+--------------+--------------+   +----+---------------+---------+-----------+--------------+--------------+ LEFTCompressibilityPhasicitySpontaneityProperties    Thrombus Aging +----+---------------+---------+-----------+--------------+--------------+ CFV Full                               pulsatile flow               +----+---------------+---------+-----------+--------------+--------------+     Summary: RIGHT: - There is no evidence of deep vein thrombosis in the lower extremity.  - No cystic structure found in the popliteal fossa. - Ultrasound characteristics of enlarged lymph nodes are noted in the groin. pulsatile flow noted  LEFT: - No evidence of common femoral vein obstruction. Pulsatile flow noted.  *See table(s) above for measurements and observations. Electronically signed by Deitra Mayo MD on 03/20/2022 at 3:39:46 PM.    Final     Review of Systems  Constitutional:  Positive for chills and diaphoresis. Negative for fever.  HENT:  Negative for ear discharge, ear pain, hearing  loss and tinnitus.   Eyes:  Negative for photophobia and pain.  Respiratory:  Negative for cough and shortness of breath.   Cardiovascular:  Negative for chest pain.  Gastrointestinal:  Negative for abdominal pain, nausea and vomiting.  Genitourinary:  Negative for dysuria, flank pain, frequency and urgency.  Musculoskeletal:  Positive for arthralgias (Right hip). Negative for back pain, myalgias and neck pain.  Neurological:  Negative for dizziness and headaches.  Hematological:  Does not bruise/bleed easily.  Psychiatric/Behavioral:  The  patient is not nervous/anxious.    Blood pressure 125/77, pulse 96, temperature 98.5 F (36.9 C), temperature source Oral, resp. rate 18, height 5' 2"$  (1.575 m), weight 74.8 kg, SpO2 100 %. Physical Exam Constitutional:      General: She is not in acute distress.    Appearance: She is well-developed. She is not diaphoretic.  HENT:     Head: Normocephalic and atraumatic.  Eyes:     General: No scleral icterus.       Right eye: No discharge.        Left eye: No discharge.     Conjunctiva/sclera: Conjunctivae normal.  Cardiovascular:     Rate and Rhythm: Normal rate and regular rhythm.  Pulmonary:     Effort: Pulmonary effort is normal. No respiratory distress.  Musculoskeletal:     Cervical back: Normal range of motion.     Comments: RLE No traumatic wounds, ecchymosis, or rash  Nontender, able to AROM ~45 degrees but painful  No knee or ankle effusion  Knee stable to varus/ valgus and anterior/posterior stress  Sens DPN, SPN, TN intact  Motor EHL, ext, flex, evers 5/5  DP 2+, PT 2+, No significant edema  Skin:    General: Skin is warm and dry.  Neurological:     Mental Status: She is alert.  Psychiatric:        Mood and Affect: Mood normal.        Behavior: Behavior normal.     Assessment/Plan: Right hip effusion -- Await IR tap of hip. Please keep NPO.    Lisette Abu, PA-C Orthopedic Surgery [phone] 03/21/2022, 9:21 AM

## 2022-03-21 NOTE — Progress Notes (Signed)
Progress Note   Patient: Kristen Robles DOB: 1990-06-28 DOA: 03/20/2022     0 DOS: the patient was seen and examined on 03/21/2022   Brief hospital course: Taken from H&P.   Kristen Robles is a 32 y.o. female with medical history significant of IBS, abnormal uterine bleeding presented to ED with right leg pain.  She drove in the car for about 3 to 4 hours on Friday and noticed tightness in the leg after driving.  Pain mostly located over anterior/medial thigh and unable to bear weight.  Patient denies any unintentional weight loss or IV drug use.  ED course.  Vital stable except mild tachycardia with heart rate at 111, labs pertinent for leukocytosis at 14.7 and absolute neutrophil count of 11.3.  CRP elevated at 6.7, ESR normal at 17. Initially CT abdomen and pelvis was done which is negative for any intra-abdominal abnormality but did show an abnormal right hip joint effusion with recommendations of MRI of right hip. MRI of right hip shows 1. Large right hip joint effusion with marked surrounding edema and inflammatory changes involving the adductor muscles as well as the vastus intermedius muscle and to a lesser extent the gluteus medius and minimus muscles. Findings are concerning for septic arthritis. 2. There is a peripherally enhancing fluid collection about the adductor muscles measuring at least 1.6 x 3.0 cm, this collection appears to be connected to the right hip joint. 3. Marrow signal is within normal limits without evidence of osteomyelitis.  Patient was started on broad-spectrum antibiotics and orthopedic was consulted. IR was also consulted for joint aspiration and labs.  2/19: Vital stable this morning.  Mild hypokalemia with potassium of 3.4.  Preliminary blood cultures negative in 12 hours.  CBC with some improvement of leukocytosis to 13.2. Awaiting aspiration with IR.  Will continue current level of care and antibiotics.  Assessment and Plan: *  Septic arthritis of hip ([place]) MRI findings worrisome for septic arthritis of R hip with possible communicating abscess (see above). However, I'm at all sure why this otherwise healthy 32 year old has septic arthritis of the hip, no obvious risk factors that I can determine, no IVDU, etc.  EGD and colonoscopy last Nov for chronic constipation was normal per reports (impression was just irritable bowel syndrome per GI report), pt is not on any chronic medications (no immunosuppressives specifically), and doesn't really have any known major chronic medical conditions. Cont empiric zosyn + vanc Ortho consulted, they requested IR for aspiration initially and then decide about joint washout if needed based on culture results and cell count F/u drainage cultures when available Get BCx if she runs a fever Morphine PRN pain HIV screen ordered Offered imaging of R knee given the HPI, but pt declines.   Subjective: Patient was seen and examined during morning rounds.  Continue to have right hip pain which increased with movement.  Physical Exam: Vitals:   03/21/22 0315 03/21/22 0325 03/21/22 0400 03/21/22 0815  BP: 113/63  139/82 125/77  Pulse: 96  (!) 105 96  Resp:   18 18  Temp:  99 F (37.2 C) 98.5 F (36.9 C) 98.5 F (36.9 C)  TempSrc:  Oral Oral Oral  SpO2: 96%  98% 100%  Weight:      Height:       General.  Well-developed lady, in no acute distress. Pulmonary.  Lungs clear bilaterally, normal respiratory effort. CV.  Regular rate and rhythm, no JVD, rub or murmur. Abdomen.  Soft, nontender,  nondistended, BS positive. CNS.  Alert and oriented .  No focal neurologic deficit. Extremities.  No edema, no cyanosis, pulses intact and symmetrical. Psychiatry.  Judgment and insight appears normal.   Data Reviewed: Prior data reviewed  Family Communication: Sister at bedside  Disposition: Status is: Inpatient Remains inpatient appropriate because: Severity of illness  Planned Discharge  Destination: Home  DVT prophylaxis.  Lovenox-holding for procedure Time spent: 40 minutes  This record has been created using Systems analyst. Errors have been sought and corrected,but may not always be located. Such creation errors do not reflect on the standard of care.   Author: Lorella Nimrod, MD 03/21/2022 2:41 PM  For on call review [email or web address].

## 2022-03-21 NOTE — H&P (Signed)
History and Physical    Patient: Kristen Robles [id number] DOB: 10-06-1990 DOA: 03/20/2022 DOS: the patient was seen and examined on 03/21/2022 PCP: No Pcp, Per Patient (Inactive)  Patient coming from: Home  Chief Complaint:  Chief Complaint  Patient presents with   Leg Pain   HPI: Kristen Robles is a 32 y.o. female with medical history significant of IBS, abnormal uterine bleeding.  Pt in to ED with c/o R leg pain.  Patient had moved a lot of heavy items on Friday and then drove in the car for 3 to 4 hours.  Noticed tightness in the leg after driving.  However woke up Saturday morning and was unable to bear weight, stating the pain is significant.  Pain located over anterior/medial thigh, described as sharp, stabbing, and worsened by, weightbearing, range of motion, and touch.  Improved by resting/non-weight bearing.  No hx of DVT/PE.  Denies redness or swelling or rash.  Denies weight loss or IVDU.  R knee occasionally gives out on her, this ongoing for some time.  Not painful nor swollen at this time, declines imaging of knee.  EGD and colonoscopy in Nov 2023  Toe injury in Nov 2023, this seems to have healed up.    Review of Systems: As mentioned in the history of present illness. All other systems reviewed and are negative. History reviewed. No pertinent past medical history. History reviewed. No pertinent surgical history. Social History:  reports that she has never smoked. She does not have any smokeless tobacco history on file. She reports current alcohol use of about 1.0 standard drink of alcohol per week. She reports that she does not use drugs.  Allergies  Allergen Reactions   Oxycodone-Acetaminophen Itching and Rash   Tomato Anaphylaxis and Hives    History reviewed. No pertinent family history.  Prior to Admission medications   Not on File    Physical Exam: Vitals:   03/21/22 0145 03/21/22 0200 03/21/22 0215 03/21/22 0230  BP: 99/76 110/62 109/65  (!) 110/57  Pulse: (!) 104 93 (!) 106 99  Resp:      Temp:      TempSrc:      SpO2: 98% 98% 100% 97%  Weight:      Height:       Constitutional: Uncomfortable Respiratory: clear to auscultation bilaterally, no wheezing, no crackles. Normal respiratory effort. No accessory muscle use.  Cardiovascular: Regular rate and rhythm, no murmurs / rubs / gallops. No extremity edema. 2+ pedal pulses. No carotid bruits.  Abdomen: no tenderness, no masses palpated. No hepatosplenomegaly. Bowel sounds positive.  Musculoskeletal: Pain with attempted ROM of R hip.  R knee, non-tender, not swollen, no erythema. Skin: no foot infection, Injured L great toe from Nov seems healed. Neurologic: CN 2-12 grossly intact. Sensation intact, DTR normal. Strength 5/5 in all 4.  Psychiatric: Normal judgment and insight. Alert and oriented x 3. Normal mood.   Data Reviewed:     MRI R hip: IMPRESSION: 1. Large right hip joint effusion with marked surrounding edema and inflammatory changes involving the adductor muscles as well as the vastus intermedius muscle and to a lesser extent the gluteus medius and minimus muscles. Findings are concerning for septic arthritis. 2. There is a peripherally enhancing fluid collection about the adductor muscles measuring at least 1.6 x 3.0 cm, this collection appears to be connected to the right hip joint. 3. Marrow signal is within normal limits without evidence of osteomyelitis.  Assessment and Plan: * Septic arthritis of  hip ([place]) MRI findings worrisome for septic arthritis of R hip with possible communicating abscess (see above). However, I'm at all sure why this otherwise healthy 32 year old has septic arthritis of the hip, no obvious risk factors that I can determine, no IVDU, etc.  EGD and colonoscopy last Nov for chronic constipation was normal per reports (impression was just irritable bowel syndrome per GI report), pt is not on any chronic medications (no  immunosuppressives specifically), and doesn't really have any known major chronic medical conditions. Cont empiric zosyn + vanc Ortho consulted, presumably they will either do washout or have IR do drainage Clear liquid diet for the moment until we get a better idea regarding timing and mode of drainage Will delay DVT chemo prophylaxis until 1400 F/u drainage cultures when available Get BCx if she runs a fever Morphine PRN pain HIV screen ordered Offered imaging of R knee given the HPI, but pt declines.      Advance Care Planning:   Code Status: Full Code  Consults: EDP consulted ortho surgery APP: [facility].  Family Communication: Family at bedside  Severity of Illness: The appropriate patient status for this patient is INPATIENT. Inpatient status is judged to be reasonable and necessary in order to provide the required intensity of service to ensure the patient's safety. The patient's presenting symptoms, physical exam findings, and initial radiographic and laboratory data in the context of their chronic comorbidities is felt to place them at high risk for further clinical deterioration. Furthermore, it is not anticipated that the patient will be medically stable for discharge from the hospital within 2 midnights of admission.   * I certify that at the point of admission it is my clinical judgment that the patient will require inpatient hospital care spanning beyond 2 midnights from the point of admission due to high intensity of service, high risk for further deterioration and high frequency of surveillance required.*  Author: Etta Quill., DO 03/21/2022 2:49 AM  For on call review [email or web address].

## 2022-03-21 NOTE — Hospital Course (Addendum)
Taken from H&P.   Kristen Robles is a 32 y.o. female with medical history significant of IBS, abnormal uterine bleeding presented to ED with right leg pain.  She drove in the car for about 3 to 4 hours on Friday and noticed tightness in the leg after driving.  Pain mostly located over anterior/medial thigh and unable to bear weight.  Patient denies any unintentional weight loss or IV drug use.  ED course.  Vital stable except mild tachycardia with heart rate at 111, labs pertinent for leukocytosis at 14.7 and absolute neutrophil count of 11.3.  CRP elevated at 6.7, ESR normal at 17. Initially CT abdomen and pelvis was done which is negative for any intra-abdominal abnormality but did show an abnormal right hip joint effusion with recommendations of MRI of right hip. MRI of right hip shows 1. Large right hip joint effusion with marked surrounding edema and inflammatory changes involving the adductor muscles as well as the vastus intermedius muscle and to a lesser extent the gluteus medius and minimus muscles. Findings are concerning for septic arthritis. 2. There is a peripherally enhancing fluid collection about the adductor muscles measuring at least 1.6 x 3.0 cm, this collection appears to be connected to the right hip joint. 3. Marrow signal is within normal limits without evidence of osteomyelitis.  Patient was started on broad-spectrum antibiotics and orthopedic was consulted. IR was also consulted for joint aspiration and labs.  2/19: Vital stable this morning.  Mild hypokalemia with potassium of 3.4.  Preliminary blood cultures negative in 12 hours.  CBC with some improvement of leukocytosis to 13.2. Awaiting aspiration with IR.  Will continue current level of care and antibiotics.  2/20; hemodynamically stable.  Right hip joint aspiration with IR yielded 16 mL of thick brown fluid, preliminary cultures with no organisms so far.  Cell counts with significant leukocytosis with WBC of   255, 000 with 91% neutrophil, more consistent with septic joint.  Negative crystals.  Leukocytosis improving. Going for joint washout with orthopedic surgery today.

## 2022-03-21 NOTE — Assessment & Plan Note (Addendum)
MRI findings worrisome for septic arthritis of R hip with possible communicating abscess (see above). However, I'm at all sure why this otherwise healthy 32 year old has septic arthritis of the hip, no obvious risk factors that I can determine, no IVDU, etc.  EGD and colonoscopy last Nov for chronic constipation was normal per reports (impression was just irritable bowel syndrome per GI report), pt is not on any chronic medications (no immunosuppressives specifically), and doesn't really have any known major chronic medical conditions. Cont empiric zosyn + vanc Ortho consulted, they requested IR for aspiration initially and then decide about joint washout if needed based on culture results and cell count F/u drainage cultures when available Get BCx if she runs a fever Morphine PRN pain HIV screen ordered Offered imaging of R knee given the HPI, but pt declines.

## 2022-03-21 NOTE — Progress Notes (Signed)
Pharmacy Antibiotic Note  Kristen Robles is a 32 y.o. female admitted on 03/20/2022 with septic arthritis.  Pharmacy has been consulted for vancomycin and Zosyn dosing.  Plan: Vancomycin 1586m x1 then 10069mIV Q12H. Goal AUC 400-550.  Expected AUC 525.  SCr used 0.8 (actual 0.69).  Zosyn 3.375g IV Q8H (4-hour infusion).  Height: 5' 2"$  (157.5 cm) Weight: 74.8 kg (165 lb) IBW/kg (Calculated) : 50.1  Temp (24hrs), Avg:98.6 F (37 C), Min:97.7 F (36.5 C), Max:99.3 F (37.4 C)  Recent Labs  Lab 03/20/22 1242 03/20/22 2038  WBC 14.7*  --   CREATININE 0.69  --   LATICACIDVEN  --  1.0    Estimated Creatinine Clearance: 96.5 mL/min (by C-G formula based on SCr of 0.69 mg/dL).    Allergies  Allergen Reactions   Oxycodone-Acetaminophen Itching and Rash   Tomato Anaphylaxis and Hives    Thank you for allowing pharmacy to be a part of this patient's care.  VeWynona NeatPharmD, BCPS  03/21/2022 4:43 AM

## 2022-03-21 NOTE — H&P (View-Only) (Signed)
Reason for Consult:Right hip effusion Referring Physician: Lorella Nimrod Time called: [id number] Time at bedside: 0901   Kristen Robles is an 32 y.o. female.  HPI: Veneda developed right hip pain on Friday. It came on gradually and there was no antecedent event though she did move some things earlier that day. By the weekend she was limping. She came to the ED yesterday and MRI showed a large hip effusion. She was admitted and orthopedic surgery was consulted. She denies prior hx/o similar. She did have some chills and sweats over the weekend.  History reviewed. No pertinent past medical history.  History reviewed. No pertinent surgical history.  History reviewed. No pertinent family history.  Social History:  reports that she has never smoked. She does not have any smokeless tobacco history on file. She reports current alcohol use of about 1.0 standard drink of alcohol per week. She reports that she does not use drugs.  Allergies:  Allergies  Allergen Reactions   Oxycodone-Acetaminophen Itching and Rash   Tomato Anaphylaxis and Hives    Medications: I have reviewed the patient's current medications.  Results for orders placed or performed during the hospital encounter of 03/20/22 (from the past 48 hour(s))  CBC with Differential     Status: Abnormal   Collection Time: 03/20/22 12:42 PM  Result Value Ref Range   WBC 14.7 (H) 4.0 - 10.5 K/uL   RBC 4.04 3.87 - 5.11 MIL/uL   Hemoglobin 13.1 12.0 - 15.0 g/dL   HCT 39.4 36.0 - 46.0 %   MCV 97.5 80.0 - 100.0 fL   MCH 32.4 26.0 - 34.0 pg   MCHC 33.2 30.0 - 36.0 g/dL   RDW 13.8 11.5 - 15.5 %   Platelets 161 150 - 400 K/uL   nRBC 0.0 0.0 - 0.2 %   Neutrophils Relative % 77 %   Neutro Abs 11.3 (H) 1.7 - 7.7 K/uL   Lymphocytes Relative 15 %   Lymphs Abs 2.2 0.7 - 4.0 K/uL   Monocytes Relative 8 %   Monocytes Absolute 1.1 (H) 0.1 - 1.0 K/uL   Eosinophils Relative 0 %   Eosinophils Absolute 0.0 0.0 - 0.5 K/uL   Basophils Relative 0  %   Basophils Absolute 0.0 0.0 - 0.1 K/uL   Immature Granulocytes 0 %   Abs Immature Granulocytes 0.04 0.00 - 0.07 K/uL    Comment: Performed at [facility], [address], [place], [place] [address]  Comprehensive metabolic panel     Status: Abnormal   Collection Time: 03/20/22 12:42 PM  Result Value Ref Range   Sodium 134 (L) 135 - 145 mmol/L   Potassium 3.6 3.5 - 5.1 mmol/L   Chloride 99 98 - 111 mmol/L   CO2 26 22 - 32 mmol/L   Glucose, Bld 112 (H) 70 - 99 mg/dL    Comment: Glucose reference range applies only to samples taken after fasting for at least 8 hours.   BUN 10 6 - 20 mg/dL   Creatinine, Ser 0.69 0.44 - 1.00 mg/dL   Calcium 9.3 8.9 - 10.3 mg/dL   Total Protein 7.4 6.5 - 8.1 g/dL   Albumin 3.7 3.5 - 5.0 g/dL   AST 21 15 - 41 U/L   ALT 17 0 - 44 U/L   Alkaline Phosphatase 43 38 - 126 U/L   Total Bilirubin 0.5 0.3 - 1.2 mg/dL   GFR, Estimated >60 >60 mL/min    Comment: (NOTE) Calculated using the CKD-EPI Creatinine Equation (  2021)    Anion gap 9 5 - 15    Comment: Performed at [facility], [place] [address] [place], [place] [address]  CK     Status: None   Collection Time: 03/20/22 12:42 PM  Result Value Ref Range   Total CK 92 38 - 234 U/L    Comment: Performed at [facility], [place] [address] [place], [place] [address]  I-Stat beta hCG blood, ED     Status: None   Collection Time: 03/20/22  3:29 PM  Result Value Ref Range   I-stat hCG, quantitative <5.0 <5 mIU/mL   Comment 3            Comment:   GEST. AGE      CONC.  (mIU/mL)   <=1 WEEK        5 - 50     2 WEEKS       50 - 500     3 WEEKS       100 - 10,000     4 WEEKS     1,000 - 30,000        FEMALE AND NON-PREGNANT FEMALE:     LESS THAN 5 mIU/mL   Sedimentation rate     Status: None   Collection Time: 03/20/22  8:07 PM  Result Value Ref Range   Sed Rate 17 0 - 22 mm/hr    Comment: Performed at [facility], [place] [address] [place], [place] [address]  C-reactive protein      Status: Abnormal   Collection Time: 03/20/22  8:07 PM  Result Value Ref Range   CRP 6.7 (H) <1.0 mg/dL    Comment: Performed at [facility], [place] [address] [place], [place] [address]  Culture, blood (routine x 2)     Status: None (Preliminary result)   Collection Time: 03/20/22  8:38 PM   Specimen: BLOOD  Result Value Ref Range   Specimen Description BLOOD LEFT ANTECUBITAL    Special Requests      BOTTLES DRAWN AEROBIC AND ANAEROBIC Blood Culture adequate volume   Culture      NO GROWTH < 12 HOURS Performed at [facility], [place] [address], [place], [place] [address]    Report Status PENDING   Lactic acid, plasma     Status: None   Collection Time: 03/20/22  8:38 PM  Result Value Ref Range   Lactic Acid, Venous 1.0 0.5 - 1.9 mmol/L    Comment: Performed at [place] [address] [place], [place] [address]  Lactic acid, plasma     Status: None   Collection Time: 03/21/22  4:59 AM  Result Value Ref Range   Lactic Acid, Venous 0.8 0.5 - 1.9 mmol/L    Comment: Performed at [facility] [address], [place],  [address]  CBC     Status: Abnormal   Collection Time: 03/21/22  4:59 AM  Result Value Ref Range   WBC 13.2 (H) 4.0 - 10.5 K/uL   RBC 3.66 (L) 3.87 - 5.11 MIL/uL   Hemoglobin 12.1 12.0 - 15.0 g/dL   HCT 36.6 36.0 - 46.0 %   MCV 100.0 80.0 - 100.0 fL   MCH 33.1 26.0 - 34.0 pg   MCHC 33.1 30.0 - 36.0 g/dL   RDW 13.6 11.5 - 15.5 %   Platelets 152 150 - 400 K/uL   nRBC 0.0 0.0 - 0.2 %  Comment: Performed at [facility], [place] [address] [place], [place] [id number]  Basic metabolic panel     Status: Abnormal   Collection Time: 03/21/22  4:59 AM  Result Value Ref Range   Sodium 135 135 - 145 mmol/L   Potassium 3.4 (L) 3.5 - 5.1 mmol/L   Chloride 101 98 - 111 mmol/L   CO2 24 22 - 32 mmol/L   Glucose, Bld 137 (H) 70 - 99 mg/dL    Comment: Glucose reference range applies only to samples taken after fasting for at least 8 hours.    BUN 8 6 - 20 mg/dL   Creatinine, Ser 0.79 0.44 - 1.00 mg/dL   Calcium 8.8 (L) 8.9 - 10.3 mg/dL   GFR, Estimated >60 >60 mL/min    Comment: (NOTE) Calculated using the CKD-EPI Creatinine Equation (2021)    Anion gap 10 5 - 15    Comment: Performed at [facility] [address], [place], [place] [address]  Magnesium     Status: None   Collection Time: 03/21/22  4:59 AM  Result Value Ref Range   Magnesium 2.1 1.7 - 2.4 mg/dL    Comment: Performed at [facility] [address], [place],  [address]    MR HIP RIGHT W WO CONTRAST  Result Date: 03/20/2022 CLINICAL DATA:  Right hip joint effusion on CT examination performed earlier on the same date. Hip pain, septic arthritis suspected EXAM: MRI OF THE RIGHT HIP WITHOUT AND WITH CONTRAST TECHNIQUE: Multiplanar, multisequence MR imaging was performed both before and after administration of intravenous contrast. CONTRAST:  7.71m GADAVIST GADOBUTROL 1 MMOL/ML IV SOLN COMPARISON:  CT examination dated March 20, 2018 FINDINGS: Bones/right hip joint/surrounding soft tissues: Marrow signal is within normal limits. There is a large right hip joint effusion with marked surrounding edema and inflammatory changes involving the surrounding muscles predominantly involving the adductor longus and brevis muscles as well as edema and inflammatory changes of the vastus intermedius muscles. There is also mild edema of the gluteus medius and minimus muscles about its insertion. Articular cartilage and labrum Articular cartilage:  Intact Labrum:  Intact Muscles and tendons Muscles and tendons: Marked edema of the muscles about the right hip joint predominantly involving the adductor muscles as well as gluteus medius/minimus and vastus intermedius muscle. There is a peripherally enhancing fluid collection about the adductor muscles measuring at least 1.6 x 3.0 cm, this collection appears to be connected to the right hip joint. Other findings  Miscellaneous: Left hip joint is unremarkable. No acute abdominal/pelvic process IMPRESSION: 1. Large right hip joint effusion with marked surrounding edema and inflammatory changes involving the adductor muscles as well as the vastus intermedius muscle and to a lesser extent the gluteus medius and minimus muscles. Findings are concerning for septic arthritis. 2. There is a peripherally enhancing fluid collection about the adductor muscles measuring at least 1.6 x 3.0 cm, this collection appears to be connected to the right hip joint. 3. Marrow signal is within normal limits without evidence of osteomyelitis. Right hip septic arthritis with surrounding inflammatory changes predominantly involving the adductor muscles. Immediate orthopedic consultation for further management is recommended. Above findings and recommendations were called by telephone at the time of interpretation on 03/20/2022 at 10:57 pm to provider PA WHeide Guile, who verbally acknowledged these results. Electronically Signed   By: IKeane PoliceD.O.   On: 03/20/2022 22:59   CT Abdomen Pelvis W Contrast  Result Date: 03/20/2022 CLINICAL  DATA:  Right groin adenopathy. EXAM: CT ABDOMEN AND PELVIS WITH CONTRAST TECHNIQUE: Multidetector CT imaging of the abdomen and pelvis was performed using the standard protocol following bolus administration of intravenous contrast. RADIATION DOSE REDUCTION: This exam was performed according to the departmental dose-optimization program which includes automated exposure control, adjustment of the mA and/or kV according to patient size and/or use of iterative reconstruction technique. CONTRAST:  68m OMNIPAQUE IOHEXOL 350 MG/ML SOLN COMPARISON:  None Available. FINDINGS: Lower chest: The lung bases are clear of acute process. No pleural effusion or pulmonary lesions. The heart is normal in size. No pericardial effusion. The distal esophagus and aorta are unremarkable. Hepatobiliary: No focal hepatic lesions or  intrahepatic biliary dilatation. The gallbladder is normal. No common bile duct dilatation. Pancreas: Normal Spleen: Normal Adrenals/Urinary Tract: Normal Stomach/Bowel: The stomach, duodenum, small bowel and colon are unremarkable. The terminal ileum and appendix are. Vascular/Lymphatic: The aorta is normal in caliber. No dissection. The branch vessels are patent. The major venous structures are patent. No mesenteric or retroperitoneal mass or adenopathy. Small scattered lymph nodes are noted. Reproductive: The uterus and ovaries are unremarkable. Other: No pelvic mass or adenopathy. Small amount of free pelvic fluid, likely physiologic. No inguinal mass or adenopathy. No abdominal wall hernia or subcutaneous lesions. Small right inguinal lymph nodes but no overt lymphadenopathy. Musculoskeletal: There is a moderate to large right hip joint effusion. I do not see any destructive bony changes to suggest septic arthritis but recommend correlation with clinical findings such as white count, fever and sed rate. No evidence of AVN. The pubic symphysis and SI joints are intact. IMPRESSION: 1. No acute abdominal/pelvic findings, mass lesions or adenopathy. 2. Small amount of free pelvic fluid, likely physiologic. 3. Abnormal right hip joint effusion. Could not exclude septic arthritis. Recommend clinical correlation. MRI right hip without and with contrast may be helpful for further evaluation. Electronically Signed   By: PMarijo SanesM.D.   On: 03/20/2022 16:31   VAS UK[place]OWER EXTREMITY VENOUS (DVT) (7a-7p)  Result Date: 03/20/2022  Lower Venous DVT Study Patient Name:  ZABUK DELVAL Date of Exam:   03/20/2022 Medical Rec #: [id number]          Accession #:    [id number] of Birth: 8September 12, 1992          Patient Gender: F Patient Age:   89years [facility]      [facility] [place]OWER EXTREMITY VENOUS (DVT) Referring Phys: ADorise Bullion --------------------------------------------------------------------------------  Indications: Thigh pain.  Comparison Study: No prior study on file Performing Technologist: CSharion DoveRVS  Examination Guidelines: A complete evaluation includes B-mode imaging, spectral Doppler, color Doppler, and power Doppler as needed of all accessible portions of each vessel. Bilateral testing is considered an integral part of a complete examination. Limited examinations for reoccurring indications may be performed as noted. The reflux portion of the exam is performed with the patient in reverse Trendelenburg.  +---------+---------------+---------+-----------+--------------+--------------+ RIGHT    CompressibilityPhasicitySpontaneityProperties    Thrombus Aging +---------+---------------+---------+-----------+--------------+--------------+ CFV      Full                               pulsatile flow               +---------+---------------+---------+-----------+--------------+--------------+ SFJ      Full                                                            +---------+---------------+---------+-----------+--------------+--------------+  FV Prox  Full                               pulsatile flow               +---------+---------------+---------+-----------+--------------+--------------+ FV Mid   Full                                                            +---------+---------------+---------+-----------+--------------+--------------+ FV DistalFull                                                            +---------+---------------+---------+-----------+--------------+--------------+ PFV      Full                               pulsatile flow               +---------+---------------+---------+-----------+--------------+--------------+ POP      Full                                                             +---------+---------------+---------+-----------+--------------+--------------+ PTV      Full                                                            +---------+---------------+---------+-----------+--------------+--------------+ PERO     Full                                                            +---------+---------------+---------+-----------+--------------+--------------+   +----+---------------+---------+-----------+--------------+--------------+ LEFTCompressibilityPhasicitySpontaneityProperties    Thrombus Aging +----+---------------+---------+-----------+--------------+--------------+ CFV Full                               pulsatile flow               +----+---------------+---------+-----------+--------------+--------------+     Summary: RIGHT: - There is no evidence of deep vein thrombosis in the lower extremity.  - No cystic structure found in the popliteal fossa. - Ultrasound characteristics of enlarged lymph nodes are noted in the groin. pulsatile flow noted  LEFT: - No evidence of common femoral vein obstruction. Pulsatile flow noted.  *See table(s) above for measurements and observations. Electronically signed by Deitra Mayo MD on 03/20/2022 at 3:39:46 PM.    Final     Review of Systems  Constitutional:  Positive for chills and diaphoresis. Negative for fever.  HENT:  Negative for ear discharge, ear pain, hearing  loss and tinnitus.   Eyes:  Negative for photophobia and pain.  Respiratory:  Negative for cough and shortness of breath.   Cardiovascular:  Negative for chest pain.  Gastrointestinal:  Negative for abdominal pain, nausea and vomiting.  Genitourinary:  Negative for dysuria, flank pain, frequency and urgency.  Musculoskeletal:  Positive for arthralgias (Right hip). Negative for back pain, myalgias and neck pain.  Neurological:  Negative for dizziness and headaches.  Hematological:  Does not bruise/bleed easily.  Psychiatric/Behavioral:  The  patient is not nervous/anxious.    Blood pressure 125/77, pulse 96, temperature 98.5 F (36.9 C), temperature source Oral, resp. rate 18, height 5' 2"$  (1.575 m), weight 74.8 kg, SpO2 100 %. Physical Exam Constitutional:      General: She is not in acute distress.    Appearance: She is well-developed. She is not diaphoretic.  HENT:     Head: Normocephalic and atraumatic.  Eyes:     General: No scleral icterus.       Right eye: No discharge.        Left eye: No discharge.     Conjunctiva/sclera: Conjunctivae normal.  Cardiovascular:     Rate and Rhythm: Normal rate and regular rhythm.  Pulmonary:     Effort: Pulmonary effort is normal. No respiratory distress.  Musculoskeletal:     Cervical back: Normal range of motion.     Comments: RLE No traumatic wounds, ecchymosis, or rash  Nontender, able to AROM ~45 degrees but painful  No knee or ankle effusion  Knee stable to varus/ valgus and anterior/posterior stress  Sens DPN, SPN, TN intact  Motor EHL, ext, flex, evers 5/5  DP 2+, PT 2+, No significant edema  Skin:    General: Skin is warm and dry.  Neurological:     Mental Status: She is alert.  Psychiatric:        Mood and Affect: Mood normal.        Behavior: Behavior normal.     Assessment/Plan: Right hip effusion -- Await IR tap of hip. Please keep NPO.    Lisette Abu, PA-C Orthopedic Surgery [phone] 03/21/2022, 9:21 AM

## 2022-03-21 NOTE — ED Notes (Signed)
ED TO INPATIENT HANDOFF REPORT  ED Nurse Name and Phone #: Monia Pouch [phone]  S Name/Age/Gender Kristen Robles 32 y.o. female Room/Bed: 002C/002C  Code Status   Code Status: Full Code  Home/SNF/Other Home Patient oriented to: x4 Is this baseline? Yes   Triage Complete: Triage complete  Chief Complaint Septic arthritis of hip [facility]) [M00.9]  Triage Note Patient here for evaluation of right leg pain that started Friday. Pain is on anterior and medial thigh, pain described as sharp and stabbing. Denies recent injury. Patient is alert, oriented, and in no apparent distress at this time.   Allergies Allergies  Allergen Reactions   Oxycodone-Acetaminophen Itching and Rash   Tomato Anaphylaxis and Hives    Level of Care/Admitting Diagnosis ED Disposition     ED Disposition  Admit   Condition  --   Comment  Hospital Area: [place] [[id number]]  Level of Care: Med-Surg [16]  May admit patient to [facility] or [facility] if equivalent level of care is available:: No  Covid Evaluation: Asymptomatic - no recent exposure (last 10 days) testing not required  Diagnosis: [id number]  Admitting Physician: Doreatha Massed  Attending Physician: Etta Quill [id number]  Certification:: I certify this patient will need inpatient services for at least 2 midnights  Estimated Length of Stay: 5          B Medical/Surgery History History reviewed. No pertinent past medical history. History reviewed. No pertinent surgical history.   A IV Location/Drains/Wounds Patient Lines/Drains/Airways Status     Active Line/Drains/Airways     Name Placement date Placement time Site Days   Peripheral IV 03/20/22 20 G Right Antecubital 03/20/22  1339  Antecubital  1            Intake/Output Last 24 hours No intake or output data in the 24 hours ending 03/21/22 0259  Labs/Imaging Results for orders placed or performed during  the hospital encounter of 03/20/22 (from the past 48 hour(s))  CBC with Differential     Status: Abnormal   Collection Time: 03/20/22 12:42 PM  Result Value Ref Range   WBC 14.7 (H) 4.0 - 10.5 K/uL   RBC 4.04 3.87 - 5.11 MIL/uL   Hemoglobin 13.1 12.0 - 15.0 g/dL   HCT 39.4 36.0 - 46.0 %   MCV 97.5 80.0 - 100.0 fL   MCH 32.4 26.0 - 34.0 pg   MCHC 33.2 30.0 - 36.0 g/dL   RDW 13.8 11.5 - 15.5 %   Platelets 161 150 - 400 K/uL   nRBC 0.0 0.0 - 0.2 %   Neutrophils Relative % 77 %   Neutro Abs 11.3 (H) 1.7 - 7.7 K/uL   Lymphocytes Relative 15 %   Lymphs Abs 2.2 0.7 - 4.0 K/uL   Monocytes Relative 8 %   Monocytes Absolute 1.1 (H) 0.1 - 1.0 K/uL   Eosinophils Relative 0 %   Eosinophils Absolute 0.0 0.0 - 0.5 K/uL   Basophils Relative 0 %   Basophils Absolute 0.0 0.0 - 0.1 K/uL   Immature Granulocytes 0 %   Abs Immature Granulocytes 0.04 0.00 - 0.07 K/uL    Comment: Performed at [facility], [address], [place], [place] [address]  Comprehensive metabolic panel     Status: Abnormal   Collection Time: 03/20/22 12:42 PM  Result Value Ref Range   Sodium 134 (L) 135 - 145 mmol/L   Potassium 3.6 3.5 - 5.1  mmol/L   Chloride 99 98 - 111 mmol/L   CO2 26 22 - 32 mmol/L   Glucose, Bld 112 (H) 70 - 99 mg/dL    Comment: Glucose reference range applies only to samples taken after fasting for at least 8 hours.   BUN 10 6 - 20 mg/dL   Creatinine, Ser 0.69 0.44 - 1.00 mg/dL   Calcium 9.3 8.9 - 10.3 mg/dL   Total Protein 7.4 6.5 - 8.1 g/dL   Albumin 3.7 3.5 - 5.0 g/dL   AST 21 15 - 41 U/L   ALT 17 0 - 44 U/L   Alkaline Phosphatase 43 38 - 126 U/L   Total Bilirubin 0.5 0.3 - 1.2 mg/dL   GFR, Estimated >60 >60 mL/min    Comment: (NOTE) Calculated using the CKD-EPI Creatinine Equation (2021)    Anion gap 9 5 - 15    Comment: Performed at [facility] [address] [place], [place] [address]  CK     Status: None   Collection Time: 03/20/22 12:42 PM  Result Value Ref Range    Total CK 92 38 - 234 U/L    Comment: Performed at [facility], [place] [address], [place], [place] [address]  I-Stat beta hCG blood, ED     Status: None   Collection Time: 03/20/22  3:29 PM  Result Value Ref Range   I-stat hCG, quantitative <5.0 <5 mIU/mL   Comment 3            Comment:   GEST. AGE      CONC.  (mIU/mL)   <=1 WEEK        5 - 50     2 WEEKS       50 - 500     3 WEEKS       100 - 10,000     4 WEEKS     1,000 - 30,000        FEMALE AND NON-PREGNANT FEMALE:     LESS THAN 5 mIU/mL   Sedimentation rate     Status: None   Collection Time: 03/20/22  8:07 PM  Result Value Ref Range   Sed Rate 17 0 - 22 mm/hr    Comment: Performed at [facility], [place] [address] [place], [place] [address]  C-reactive protein     Status: Abnormal   Collection Time: 03/20/22  8:07 PM  Result Value Ref Range   CRP 6.7 (H) <1.0 mg/dL    Comment: Performed at [facility], [place] [address], [place], [place] [address]  Lactic acid, plasma     Status: None   Collection Time: 03/20/22  8:38 PM  Result Value Ref Range   Lactic Acid, Venous 1.0 0.5 - 1.9 mmol/L    Comment: Performed at [facility] [address] [place], [place] [address]   MR HIP RIGHT W WO CONTRAST  Result Date: 03/20/2022 CLINICAL DATA:  Right hip joint effusion on CT examination performed earlier on the same date. Hip pain, septic arthritis suspected EXAM: MRI OF THE RIGHT HIP WITHOUT AND WITH CONTRAST TECHNIQUE: Multiplanar, multisequence MR imaging was performed both before and after administration of intravenous contrast. CONTRAST:  7.19m GADAVIST GADOBUTROL 1 MMOL/ML IV SOLN COMPARISON:  CT examination dated March 20, 2018 FINDINGS: Bones/right hip joint/surrounding soft tissues: Marrow signal is within normal limits. There is a large right hip joint effusion with marked surrounding edema and inflammatory changes involving the  surrounding muscles predominantly involving the adductor longus and brevis  muscles as well as edema and inflammatory changes of the vastus intermedius muscles. There is also mild edema of the gluteus medius and minimus muscles about its insertion. Articular cartilage and labrum Articular cartilage:  Intact Labrum:  Intact Muscles and tendons Muscles and tendons: Marked edema of the muscles about the right hip joint predominantly involving the adductor muscles as well as gluteus medius/minimus and vastus intermedius muscle. There is a peripherally enhancing fluid collection about the adductor muscles measuring at least 1.6 x 3.0 cm, this collection appears to be connected to the right hip joint. Other findings Miscellaneous: Left hip joint is unremarkable. No acute abdominal/pelvic process IMPRESSION: 1. Large right hip joint effusion with marked surrounding edema and inflammatory changes involving the adductor muscles as well as the vastus intermedius muscle and to a lesser extent the gluteus medius and minimus muscles. Findings are concerning for septic arthritis. 2. There is a peripherally enhancing fluid collection about the adductor muscles measuring at least 1.6 x 3.0 cm, this collection appears to be connected to the right hip joint. 3. Marrow signal is within normal limits without evidence of osteomyelitis. Right hip septic arthritis with surrounding inflammatory changes predominantly involving the adductor muscles. Immediate orthopedic consultation for further management is recommended. Above findings and recommendations were called by telephone at the time of interpretation on 03/20/2022 at 10:57 pm to provider PA Heide Guile , who verbally acknowledged these results. Electronically Signed   By: Keane Police D.O.   On: 03/20/2022 22:59   CT Abdomen Pelvis W Contrast  Result Date: 03/20/2022 CLINICAL DATA:  Right groin adenopathy. EXAM: CT ABDOMEN AND PELVIS WITH CONTRAST TECHNIQUE: Multidetector CT imaging of the abdomen and pelvis was performed using the standard protocol following  bolus administration of intravenous contrast. RADIATION DOSE REDUCTION: This exam was performed according to the departmental dose-optimization program which includes automated exposure control, adjustment of the mA and/or kV according to patient size and/or use of iterative reconstruction technique. CONTRAST:  17m OMNIPAQUE IOHEXOL 350 MG/ML SOLN COMPARISON:  None Available. FINDINGS: Lower chest: The lung bases are clear of acute process. No pleural effusion or pulmonary lesions. The heart is normal in size. No pericardial effusion. The distal esophagus and aorta are unremarkable. Hepatobiliary: No focal hepatic lesions or intrahepatic biliary dilatation. The gallbladder is normal. No common bile duct dilatation. Pancreas: Normal Spleen: Normal Adrenals/Urinary Tract: Normal Stomach/Bowel: The stomach, duodenum, small bowel and colon are unremarkable. The terminal ileum and appendix are. Vascular/Lymphatic: The aorta is normal in caliber. No dissection. The branch vessels are patent. The major venous structures are patent. No mesenteric or retroperitoneal mass or adenopathy. Small scattered lymph nodes are noted. Reproductive: The uterus and ovaries are unremarkable. Other: No pelvic mass or adenopathy. Small amount of free pelvic fluid, likely physiologic. No inguinal mass or adenopathy. No abdominal wall hernia or subcutaneous lesions. Small right inguinal lymph nodes but no overt lymphadenopathy. Musculoskeletal: There is a moderate to large right hip joint effusion. I do not see any destructive bony changes to suggest septic arthritis but recommend correlation with clinical findings such as white count, fever and sed rate. No evidence of AVN. The pubic symphysis and SI joints are intact. IMPRESSION: 1. No acute abdominal/pelvic findings, mass lesions or adenopathy. 2. Small amount of free pelvic fluid, likely physiologic. 3. Abnormal right hip joint effusion. Could not exclude septic arthritis. Recommend  clinical correlation. MRI right hip without and with contrast may be helpful  for further evaluation. Electronically Signed   By: Marijo Sanes M.D.   On: 03/20/2022 16:31   VAS [place] LOWER EXTREMITY VENOUS (DVT) (7a-7p)  Result Date: 03/20/2022  Lower Venous DVT Study Patient Name:  Kristen Robles  Date of Exam:   03/20/2022 Medical Rec #: [id number]           Accession #:    [id number] Date of Birth: 07/01/1990           Patient Gender: F Patient Age:   8 years Exam Location:  [facility] Procedure:      VAS [place] LOWER EXTREMITY VENOUS (DVT) Referring Phys: Dorise Bullion --------------------------------------------------------------------------------  Indications: Thigh pain.  Comparison Study: No prior study on file Performing Technologist: Sharion Dove RVS  Examination Guidelines: A complete evaluation includes B-mode imaging, spectral Doppler, color Doppler, and power Doppler as needed of all accessible portions of each vessel. Bilateral testing is considered an integral part of a complete examination. Limited examinations for reoccurring indications may be performed as noted. The reflux portion of the exam is performed with the patient in reverse Trendelenburg.  +---------+---------------+---------+-----------+--------------+--------------+ RIGHT    CompressibilityPhasicitySpontaneityProperties    Thrombus Aging +---------+---------------+---------+-----------+--------------+--------------+ CFV      Full                               pulsatile flow               +---------+---------------+---------+-----------+--------------+--------------+ SFJ      Full                                                            +---------+---------------+---------+-----------+--------------+--------------+ FV Prox  Full                               pulsatile flow               +---------+---------------+---------+-----------+--------------+--------------+ FV Mid   Full                                                             +---------+---------------+---------+-----------+--------------+--------------+ FV DistalFull                                                            +---------+---------------+---------+-----------+--------------+--------------+ PFV      Full                               pulsatile flow               +---------+---------------+---------+-----------+--------------+--------------+ POP      Full                                                            +---------+---------------+---------+-----------+--------------+--------------+  PTV      Full                                                            +---------+---------------+---------+-----------+--------------+--------------+ PERO     Full                                                            +---------+---------------+---------+-----------+--------------+--------------+   +----+---------------+---------+-----------+--------------+--------------+ LEFTCompressibilityPhasicitySpontaneityProperties    Thrombus Aging +----+---------------+---------+-----------+--------------+--------------+ CFV Full                               pulsatile flow               +----+---------------+---------+-----------+--------------+--------------+     Summary: RIGHT: - There is no evidence of deep vein thrombosis in the lower extremity.  - No cystic structure found in the popliteal fossa. - Ultrasound characteristics of enlarged lymph nodes are noted in the groin. pulsatile flow noted  LEFT: - No evidence of common femoral vein obstruction. Pulsatile flow noted.  *See table(s) above for measurements and observations. Electronically signed by Deitra Mayo MD on 03/20/2022 at 3:39:46 PM.    Final     Pending Labs Unresulted Labs (From admission, onward)     Start     Ordered   03/21/22 0500  CBC  Tomorrow morning,   R        03/21/22 0223   03/21/22 [id number]  Basic  metabolic panel  Tomorrow morning,   R        03/21/22 0223   03/21/22 0221  HIV Antibody (routine testing w rflx)  (HIV Antibody (Routine testing w reflex) panel)  Once,   R        03/21/22 0223   03/20/22 2007  Lactic acid, plasma  Now then every 2 hours,   R (with STAT occurrences)      03/20/22 2006   03/20/22 2006  Culture, blood (routine x 2)  BLOOD CULTURE X 2,   R (with STAT occurrences)      03/20/22 2006            Vitals/Pain Today's Vitals   03/21/22 0145 03/21/22 0200 03/21/22 0215 03/21/22 0230  BP: 99/76 110/62 109/65 (!) 110/57  Pulse: (!) 104 93 (!) 106 99  Resp:      Temp:      TempSrc:      SpO2: 98% 98% 100% 97%  Weight:      Height:      PainSc:        Isolation Precautions No active isolations  Medications Medications  vancomycin (VANCOREADY) IVPB 1500 mg/300 mL (1,500 mg Intravenous New Bag/Given 03/20/22 2342)  morphine (PF) 2 MG/ML injection 2-4 mg (has no administration in time range)  enoxaparin (LOVENOX) injection 40 mg (has no administration in time range)  acetaminophen (TYLENOL) tablet 650 mg (has no administration in time range)    Or  acetaminophen (TYLENOL) suppository 650 mg (has no administration in time range)  ondansetron (ZOFRAN) tablet 4 mg (has no administration in time range)  Or  ondansetron (ZOFRAN) injection 4 mg (has no administration in time range)  lidocaine (LIDODERM) 5 % 1 patch (1 patch Transdermal Patch Applied 03/20/22 1325)  morphine (PF) 4 MG/ML injection 4 mg (4 mg Intravenous Given 03/20/22 1623)  ondansetron (ZOFRAN) injection 4 mg (4 mg Intravenous Given 03/20/22 1622)  iohexol (OMNIPAQUE) 350 MG/ML injection 75 mL (75 mLs Intravenous Contrast Given 03/20/22 1608)  morphine (PF) 2 MG/ML injection 2 mg (2 mg Intravenous Given 03/20/22 1851)  LORazepam (ATIVAN) tablet 0.5 mg (0.5 mg Oral Given 03/20/22 2016)  sodium chloride 0.9 % bolus 1,000 mL (0 mLs Intravenous Stopped 03/20/22 2229)  morphine (PF) 4 MG/ML  injection 4 mg (4 mg Intravenous Given 03/20/22 2031)  gadobutrol (GADAVIST) 1 MMOL/ML injection 7.5 mL (7.5 mLs Intravenous Contrast Given 03/20/22 2125)  morphine (PF) 4 MG/ML injection 4 mg (4 mg Intravenous Given 03/20/22 2257)  piperacillin-tazobactam (ZOSYN) IVPB 3.375 g (0 g Intravenous Stopped 03/20/22 2338)    Mobility Walks at baseline. At this moment is not mobile due to pain.      Focused Assessments  Muscular skeletal: RLE hip infection causing pain and lack of mobility   R Recommendations: See Admitting Provider Note  Report given to:   Additional Notes: Healthy patient with no known medical hx other than a GI scope last year for colitis. Drove here to visit friends had hip pain and came in. MRI showed infection, unknown cause. No hip injections or injury.

## 2022-03-21 NOTE — Progress Notes (Signed)
Patient continues to C/O right hip pain 10/10. PRN Morphine 4 mg given. Upon recheck patient still rates pain at 3/4. Encouraged to reposition, evaluate and ice. Family at bedside.

## 2022-03-22 ENCOUNTER — Inpatient Hospital Stay (HOSPITAL_COMMUNITY): Payer: Medicaid Other | Admitting: Certified Registered"

## 2022-03-22 ENCOUNTER — Other Ambulatory Visit: Payer: Self-pay

## 2022-03-22 ENCOUNTER — Encounter (HOSPITAL_COMMUNITY)
Admission: EM | Disposition: A | Discharge status: Home / Self Care | Payer: Self-pay | Source: Home / Self Care | Attending: Internal Medicine

## 2022-03-22 ENCOUNTER — Encounter (HOSPITAL_COMMUNITY): Payer: Self-pay | Admitting: Internal Medicine

## 2022-03-22 DIAGNOSIS — M009 Pyogenic arthritis, unspecified: Secondary | ICD-10-CM

## 2022-03-22 HISTORY — PX: I & D EXTREMITY: SHX5045

## 2022-03-22 LAB — CBC
HCT: 35.2 % — ABNORMAL LOW (ref 36.0–46.0)
Hemoglobin: 12.1 g/dL (ref 12.0–15.0)
MCH: 33.4 pg (ref 26.0–34.0)
MCHC: 34.4 g/dL (ref 30.0–36.0)
MCV: 97.2 fL (ref 80.0–100.0)
Platelets: 157 10*3/uL (ref 150–400)
RBC: 3.62 MIL/uL — ABNORMAL LOW (ref 3.87–5.11)
RDW: 13.2 % (ref 11.5–15.5)
WBC: 12.7 10*3/uL — ABNORMAL HIGH (ref 4.0–10.5)
nRBC: 0 % (ref 0.0–0.2)

## 2022-03-22 LAB — GRAM STAIN

## 2022-03-22 LAB — SYNOVIAL CELL COUNT + DIFF, W/ CRYSTALS
Crystals, Fluid: NONE SEEN
Eosinophils-Synovial: 0 % (ref 0–1)
Lymphocytes-Synovial Fld: 9 % (ref 0–20)
Monocyte-Macrophage-Synovial Fluid: 0 % — ABNORMAL LOW (ref 50–90)
Neutrophil, Synovial: 91 % — ABNORMAL HIGH (ref 0–25)
WBC, Synovial: 255000 /mm3 — ABNORMAL HIGH (ref 0–200)

## 2022-03-22 LAB — SURGICAL PCR SCREEN
MRSA, PCR: NEGATIVE
Staphylococcus aureus: POSITIVE — AB

## 2022-03-22 LAB — BASIC METABOLIC PANEL
Anion gap: 8 (ref 5–15)
BUN: 6 mg/dL (ref 6–20)
CO2: 25 mmol/L (ref 22–32)
Calcium: 8.8 mg/dL — ABNORMAL LOW (ref 8.9–10.3)
Chloride: 100 mmol/L (ref 98–111)
Creatinine, Ser: 0.78 mg/dL (ref 0.44–1.00)
GFR, Estimated: >60 mL/min (ref >60)
Glucose, Bld: 128 mg/dL — ABNORMAL HIGH (ref 70–99)
Potassium: 3.4 mmol/L — ABNORMAL LOW (ref 3.5–5.1)
Sodium: 133 mmol/L — ABNORMAL LOW (ref 135–145)

## 2022-03-22 SURGERY — IRRIGATION AND DEBRIDEMENT EXTREMITY
Anesthesia: General | Site: Hip | Laterality: Right

## 2022-03-22 MED ORDER — DEXAMETHASONE SODIUM PHOSPHATE 10 MG/ML IJ SOLN
INTRAMUSCULAR | Status: DC | PRN
  Administered 2022-03-22: 10 mg via INTRAVENOUS

## 2022-03-22 MED ORDER — VANCOMYCIN HCL 1000 MG IV SOLR
INTRAVENOUS | Status: AC
  Filled 2022-03-22: qty 20

## 2022-03-22 MED ORDER — SODIUM CHLORIDE 0.9 % IR SOLN
Status: DC | PRN
  Administered 2022-03-22: 3000 mL

## 2022-03-22 MED ORDER — ENOXAPARIN SODIUM 40 MG/0.4ML IJ SOSY
40.0000 mg | PREFILLED_SYRINGE | INTRAMUSCULAR | Status: DC
  Administered 2022-03-23 – 2022-03-27 (×5): 40 mg via SUBCUTANEOUS
  Filled 2022-03-22 (×5): qty 0.4

## 2022-03-22 MED ORDER — ONDANSETRON HCL 4 MG PO TABS: 4.0000 mg | ORAL_TABLET | Freq: Four times a day (QID) | ORAL | Status: DC | PRN

## 2022-03-22 MED ORDER — HYDROMORPHONE HCL 1 MG/ML IJ SOLN
0.2500 mg | INTRAMUSCULAR | Status: DC | PRN
  Administered 2022-03-22: 0.5 mg via INTRAVENOUS

## 2022-03-22 MED ORDER — HYDROMORPHONE HCL 1 MG/ML IJ SOLN
0.5000 mg | INTRAMUSCULAR | Status: DC | PRN
  Administered 2022-03-22 – 2022-03-28 (×16): 1 mg via INTRAVENOUS
  Filled 2022-03-22 (×16): qty 1

## 2022-03-22 MED ORDER — CHLORHEXIDINE GLUCONATE 0.12 % MT SOLN: 15.0000 mL | Freq: Once | OROMUCOSAL | Status: AC

## 2022-03-22 MED ORDER — ORAL CARE MOUTH RINSE: 15.0000 mL | Freq: Once | OROMUCOSAL | Status: AC

## 2022-03-22 MED ORDER — FENTANYL CITRATE (PF) 250 MCG/5ML IJ SOLN
INTRAMUSCULAR | Status: AC
  Filled 2022-03-22: qty 5

## 2022-03-22 MED ORDER — DOCUSATE SODIUM 100 MG PO CAPS
100.0000 mg | ORAL_CAPSULE | Freq: Two times a day (BID) | ORAL | Status: DC
  Administered 2022-03-22 – 2022-03-28 (×12): 100 mg via ORAL
  Filled 2022-03-22 (×12): qty 1

## 2022-03-22 MED ORDER — FENTANYL CITRATE (PF) 250 MCG/5ML IJ SOLN
INTRAMUSCULAR | Status: DC | PRN
  Administered 2022-03-22: 75 ug via INTRAVENOUS
  Administered 2022-03-22: 100 ug via INTRAVENOUS
  Administered 2022-03-22: 75 ug via INTRAVENOUS

## 2022-03-22 MED ORDER — SUGAMMADEX SODIUM 200 MG/2ML IV SOLN
INTRAVENOUS | Status: DC | PRN
  Administered 2022-03-22: 200 mg via INTRAVENOUS

## 2022-03-22 MED ORDER — HYDROCODONE-ACETAMINOPHEN 5-325 MG PO TABS
1.0000 | ORAL_TABLET | ORAL | Status: DC | PRN
  Administered 2022-03-23 – 2022-03-27 (×15): 1 via ORAL
  Filled 2022-03-22 (×16): qty 1

## 2022-03-22 MED ORDER — MIDAZOLAM HCL 2 MG/2ML IJ SOLN
INTRAMUSCULAR | Status: DC | PRN
  Administered 2022-03-22: 2 mg via INTRAVENOUS

## 2022-03-22 MED ORDER — VANCOMYCIN HCL 1000 MG IV SOLR
INTRAVENOUS | Status: DC | PRN
  Administered 2022-03-22: 1000 mg via TOPICAL

## 2022-03-22 MED ORDER — CEFAZOLIN SODIUM-DEXTROSE 1-4 GM/50ML-% IV SOLN: 1.0000 g | Freq: Four times a day (QID) | INTRAVENOUS | Status: DC

## 2022-03-22 MED ORDER — METOCLOPRAMIDE HCL 5 MG PO TABS: 5.0000 mg | ORAL_TABLET | Freq: Three times a day (TID) | ORAL | Status: DC | PRN

## 2022-03-22 MED ORDER — MUPIROCIN 2 % EX OINT
1.0000 | TOPICAL_OINTMENT | Freq: Two times a day (BID) | CUTANEOUS | Status: AC
  Administered 2022-03-22 – 2022-03-26 (×10): 1 via NASAL
  Filled 2022-03-22 (×4): qty 22

## 2022-03-22 MED ORDER — PROPOFOL 10 MG/ML IV BOLUS
INTRAVENOUS | Status: DC | PRN
  Administered 2022-03-22: 140 mg via INTRAVENOUS

## 2022-03-22 MED ORDER — ONDANSETRON HCL 4 MG/2ML IJ SOLN: 4.0000 mg | Freq: Once | INTRAMUSCULAR | Status: DC | PRN

## 2022-03-22 MED ORDER — KETOROLAC TROMETHAMINE 30 MG/ML IJ SOLN
30.0000 mg | Freq: Once | INTRAMUSCULAR | Status: AC | PRN
  Administered 2022-03-22: 30 mg via INTRAVENOUS

## 2022-03-22 MED ORDER — OXYCODONE HCL 5 MG PO TABS: 10.0000 mg | ORAL_TABLET | ORAL | Status: DC | PRN

## 2022-03-22 MED ORDER — ONDANSETRON HCL 4 MG/2ML IJ SOLN
INTRAMUSCULAR | Status: AC
  Filled 2022-03-22: qty 2

## 2022-03-22 MED ORDER — 0.9 % SODIUM CHLORIDE (POUR BTL) OPTIME
TOPICAL | Status: DC | PRN
  Administered 2022-03-22: 1000 mL

## 2022-03-22 MED ORDER — OXYCODONE HCL 5 MG PO TABS: 5.0000 mg | ORAL_TABLET | ORAL | Status: DC | PRN

## 2022-03-22 MED ORDER — METHOCARBAMOL 500 MG PO TABS
500.0000 mg | ORAL_TABLET | Freq: Four times a day (QID) | ORAL | Status: DC | PRN
  Administered 2022-03-23 – 2022-03-27 (×11): 500 mg via ORAL
  Filled 2022-03-22 (×11): qty 1

## 2022-03-22 MED ORDER — TRAMADOL HCL 50 MG PO TABS
50.0000 mg | ORAL_TABLET | Freq: Four times a day (QID) | ORAL | Status: DC | PRN
  Administered 2022-03-23: 50 mg via ORAL
  Filled 2022-03-22 (×2): qty 1

## 2022-03-22 MED ORDER — METOCLOPRAMIDE HCL 5 MG/ML IJ SOLN: 5.0000 mg | Freq: Three times a day (TID) | INTRAMUSCULAR | Status: DC | PRN

## 2022-03-22 MED ORDER — KETOROLAC TROMETHAMINE 30 MG/ML IJ SOLN
INTRAMUSCULAR | Status: AC
  Filled 2022-03-22: qty 1

## 2022-03-22 MED ORDER — CEFAZOLIN SODIUM-DEXTROSE 2-4 GM/100ML-% IV SOLN
INTRAVENOUS | Status: AC
  Filled 2022-03-22: qty 100

## 2022-03-22 MED ORDER — ONDANSETRON HCL 4 MG/2ML IJ SOLN: 4.0000 mg | Freq: Four times a day (QID) | INTRAMUSCULAR | Status: DC | PRN

## 2022-03-22 MED ORDER — LACTATED RINGERS IV SOLN: INTRAVENOUS | Status: DC

## 2022-03-22 MED ORDER — METHOCARBAMOL 1000 MG/10ML IJ SOLN: 500.0000 mg | Freq: Four times a day (QID) | INTRAVENOUS | Status: DC | PRN

## 2022-03-22 MED ORDER — HYDROMORPHONE HCL 1 MG/ML IJ SOLN
INTRAMUSCULAR | Status: AC
  Filled 2022-03-22: qty 1

## 2022-03-22 MED ORDER — LIDOCAINE 2% (20 MG/ML) 5 ML SYRINGE
INTRAMUSCULAR | Status: DC | PRN
  Administered 2022-03-22: 60 mg via INTRAVENOUS

## 2022-03-22 MED ORDER — DIPHENHYDRAMINE HCL 12.5 MG/5ML PO ELIX: 12.5000 mg | ORAL_SOLUTION | ORAL | Status: DC | PRN

## 2022-03-22 MED ORDER — ACETAMINOPHEN 500 MG PO TABS
1000.0000 mg | ORAL_TABLET | Freq: Four times a day (QID) | ORAL | Status: AC
  Administered 2022-03-22 – 2022-03-23 (×3): 1000 mg via ORAL
  Filled 2022-03-22 (×4): qty 2

## 2022-03-22 MED ORDER — MIDAZOLAM HCL 2 MG/2ML IJ SOLN
INTRAMUSCULAR | Status: AC
  Filled 2022-03-22: qty 2

## 2022-03-22 MED ORDER — ROCURONIUM BROMIDE 10 MG/ML (PF) SYRINGE
PREFILLED_SYRINGE | INTRAVENOUS | Status: DC | PRN
  Administered 2022-03-22: 60 mg via INTRAVENOUS

## 2022-03-22 MED ORDER — ONDANSETRON HCL 4 MG/2ML IJ SOLN
INTRAMUSCULAR | Status: DC | PRN
  Administered 2022-03-22: 4 mg via INTRAVENOUS

## 2022-03-22 MED ORDER — DEXAMETHASONE SODIUM PHOSPHATE 10 MG/ML IJ SOLN
INTRAMUSCULAR | Status: AC
  Filled 2022-03-22: qty 1

## 2022-03-22 MED ORDER — PROPOFOL 10 MG/ML IV BOLUS
INTRAVENOUS | Status: AC
  Filled 2022-03-22: qty 20

## 2022-03-22 MED ORDER — CHLORHEXIDINE GLUCONATE 0.12 % MT SOLN
OROMUCOSAL | Status: AC
  Administered 2022-03-22: 15 mL via OROMUCOSAL
  Filled 2022-03-22: qty 15

## 2022-03-22 SURGICAL SUPPLY — 66 items
BAG COUNTER SPONGE SURGICOUNT (BAG) ×1 IMPLANT
BANDAGE ESMARK 6X9 LF (GAUZE/BANDAGES/DRESSINGS) IMPLANT
BLADE SURG 10 STRL SS (BLADE) ×1 IMPLANT
BNDG COHESIVE 4X5 TAN STRL (GAUZE/BANDAGES/DRESSINGS) ×1 IMPLANT
BNDG ELASTIC 4X5.8 VLCR STR LF (GAUZE/BANDAGES/DRESSINGS) ×1 IMPLANT
BNDG ELASTIC 6X5.8 VLCR STR LF (GAUZE/BANDAGES/DRESSINGS) ×1 IMPLANT
BNDG ESMARK 4X9 LF (GAUZE/BANDAGES/DRESSINGS) IMPLANT
BNDG ESMARK 6X9 LF (GAUZE/BANDAGES/DRESSINGS)
BNDG GAUZE DERMACEA FLUFF 4 (GAUZE/BANDAGES/DRESSINGS) ×1 IMPLANT
CNTNR URN SCR LID CUP LEK RST (MISCELLANEOUS) IMPLANT
CONT SPEC 4OZ STRL OR WHT (MISCELLANEOUS)
COVER SURGICAL LIGHT HANDLE (MISCELLANEOUS) ×1 IMPLANT
CUFF TOURN SGL LL 12 NO SLV (MISCELLANEOUS) IMPLANT
CUFF TOURN SGL QUICK 34 (TOURNIQUET CUFF)
CUFF TRNQT CYL 34X4.125X (TOURNIQUET CUFF) IMPLANT
DRAPE IMP U-DRAPE 54X76 (DRAPES) IMPLANT
DRAPE INCISE IOBAN 66X45 STRL (DRAPES) IMPLANT
DRAPE SURG 17X23 STRL (DRAPES) IMPLANT
DRAPE U-SHAPE 47X51 STRL (DRAPES) IMPLANT
DRSG MEPILEX POST OP 4X8 (GAUZE/BANDAGES/DRESSINGS) IMPLANT
DURAPREP 26ML APPLICATOR (WOUND CARE) ×1 IMPLANT
ELECT BLADE 6.5 EXT (BLADE) IMPLANT
ELECT REM PT RETURN 9FT ADLT (ELECTROSURGICAL)
ELECTRODE REM PT RTRN 9FT ADLT (ELECTROSURGICAL) IMPLANT
EVACUATOR 1/8 PVC DRAIN (DRAIN) IMPLANT
FACESHIELD WRAPAROUND (MASK) ×1 IMPLANT
FACESHIELD WRAPAROUND OR TEAM (MASK) ×1 IMPLANT
GAUZE PAD ABD 8X10 STRL (GAUZE/BANDAGES/DRESSINGS) ×1 IMPLANT
GAUZE SPONGE 4X4 12PLY STRL (GAUZE/BANDAGES/DRESSINGS) ×1 IMPLANT
GAUZE XEROFORM 1X8 LF (GAUZE/BANDAGES/DRESSINGS) ×1 IMPLANT
GLOVE BIO SURGEON STRL SZ7.5 (GLOVE) ×1 IMPLANT
GLOVE BIOGEL PI IND STRL 7.5 (GLOVE) ×1 IMPLANT
GLOVE BIOGEL PI IND STRL 8 (GLOVE) ×1 IMPLANT
GLOVE SURG SYN 7.5  E (GLOVE) ×1
GLOVE SURG SYN 7.5 E (GLOVE) ×1 IMPLANT
GLOVE SURG SYN 7.5 PF PI (GLOVE) ×1 IMPLANT
GOWN STRL REUS W/ TWL LRG LVL3 (GOWN DISPOSABLE) ×2 IMPLANT
GOWN STRL REUS W/ TWL XL LVL3 (GOWN DISPOSABLE) ×2 IMPLANT
GOWN STRL REUS W/TWL LRG LVL3 (GOWN DISPOSABLE) ×2
GOWN STRL REUS W/TWL XL LVL3 (GOWN DISPOSABLE) ×2
HANDPIECE INTERPULSE COAX TIP (DISPOSABLE)
KIT BASIN OR (CUSTOM PROCEDURE TRAY) ×1 IMPLANT
KIT TURNOVER KIT B (KITS) ×1 IMPLANT
MANIFOLD NEPTUNE II (INSTRUMENTS) ×1 IMPLANT
NDL HYPO 25GX1X1/2 BEV (NEEDLE) IMPLANT
NEEDLE HYPO 25GX1X1/2 BEV (NEEDLE) IMPLANT
NS IRRIG 1000ML POUR BTL (IV SOLUTION) ×1 IMPLANT
PACK ORTHO EXTREMITY (CUSTOM PROCEDURE TRAY) ×1 IMPLANT
PAD ARMBOARD 7.5X6 YLW CONV (MISCELLANEOUS) ×2 IMPLANT
SET CYSTO W/LG BORE CLAMP LF (SET/KITS/TRAYS/PACK) IMPLANT
SET HNDPC FAN SPRY TIP SCT (DISPOSABLE) IMPLANT
SPONGE T-LAP 18X18 ~~LOC~~+RFID (SPONGE) IMPLANT
STOCKINETTE IMPERVIOUS 9X36 MD (GAUZE/BANDAGES/DRESSINGS) ×1 IMPLANT
STRIP CLOSURE SKIN 1/2X4 (GAUZE/BANDAGES/DRESSINGS) IMPLANT
SUT ETHILON 3 0 PS 1 (SUTURE) IMPLANT
SUT MON AB 2-0 CT1 36 (SUTURE) IMPLANT
SUT PDS AB 2-0 CT1 27 (SUTURE) IMPLANT
SUT VIC AB 0 CT1 27 (SUTURE) ×1
SUT VIC AB 0 CT1 27XBRD ANBCTR (SUTURE) IMPLANT
SWAB CULTURE ESWAB REG 1ML (MISCELLANEOUS) IMPLANT
SYR CONTROL 10ML LL (SYRINGE) IMPLANT
TOWEL GREEN STERILE (TOWEL DISPOSABLE) ×1 IMPLANT
TOWEL GREEN STERILE FF (TOWEL DISPOSABLE) ×1 IMPLANT
TUBE CONNECTING 12X1/4 (SUCTIONS) ×1 IMPLANT
UNDERPAD 30X36 HEAVY ABSORB (UNDERPADS AND DIAPERS) ×1 IMPLANT
YANKAUER SUCT BULB TIP NO VENT (SUCTIONS) ×1 IMPLANT

## 2022-03-22 NOTE — Transfer of Care (Signed)
Immediate Anesthesia Transfer of Care Note  Patient: Kristen Robles  Procedure(s) Performed: IRRIGATION AND DEBRIDEMENT RIGHT HIP (Right: Hip)  Patient Location: PACU  Anesthesia Type:General  Level of Consciousness: drowsy and patient cooperative  Airway & Oxygen Therapy: Patient Spontanous Breathing  Post-op Assessment: Report given to RN and Post -op Vital signs reviewed and stable  Post vital signs: Reviewed and stable  Last Vitals:  Vitals Value Taken Time  BP 148/97 03/22/22 1622  Temp    Pulse 104 03/22/22 1623  Resp    SpO2 95 % 03/22/22 1623  Vitals shown include unvalidated device data.  Last Pain:  Vitals:   03/22/22 1359  TempSrc: Oral  PainSc: 7       Patients Stated Pain Goal: 3 ([id number])  Complications: No notable events documented.

## 2022-03-22 NOTE — Interval H&P Note (Signed)
History and Physical Interval Note:  03/22/2022 3:03 PM  Kristen Robles  has presented today for surgery, with the diagnosis of Septic right hip.  The various methods of treatment have been discussed with the patient and family. After consideration of risks, benefits and other options for treatment, the patient has consented to  Procedure(s): IRRIGATION AND DEBRIDEMENT HIP (Right) as a surgical intervention.  The patient's history has been reviewed, patient examined, no change in status, stable for surgery.  I have reviewed the patient's chart and labs.  Questions were answered to the patient's satisfaction.     [name]

## 2022-03-22 NOTE — Discharge Instructions (Addendum)
Do not to drive or handle delicate objects (like holding babies) if under the influence of prescribed pain medications

## 2022-03-22 NOTE — Progress Notes (Signed)
Progress Note   Patient: Kristen Robles [id number] DOB: [birth year] DOA: 03/20/2022     1 DOS: the patient was seen and examined on 03/22/2022   Brief hospital course: Taken from H&P.   Kristen Robles is a 32 y.o. female with medical history significant of IBS, abnormal uterine bleeding presented to ED with right leg pain.  She drove in the car for about 3 to 4 hours on Friday and noticed tightness in the leg after driving.  Pain mostly located over anterior/medial thigh and unable to bear weight.  Patient denies any unintentional weight loss or IV drug use.  ED course.  Vital stable except mild tachycardia with heart rate at 111, labs pertinent for leukocytosis at 14.7 and absolute neutrophil count of 11.3.  CRP elevated at 6.7, ESR normal at 17. Initially CT abdomen and pelvis was done which is negative for any intra-abdominal abnormality but did show an abnormal right hip joint effusion with recommendations of MRI of right hip. MRI of right hip shows 1. Large right hip joint effusion with marked surrounding edema and inflammatory changes involving the adductor muscles as well as the vastus intermedius muscle and to a lesser extent the gluteus medius and minimus muscles. Findings are concerning for septic arthritis. 2. There is a peripherally enhancing fluid collection about the adductor muscles measuring at least 1.6 x 3.0 cm, this collection appears to be connected to the right hip joint. 3. Marrow signal is within normal limits without evidence of osteomyelitis.  Patient was started on broad-spectrum antibiotics and orthopedic was consulted. IR was also consulted for joint aspiration and labs.  2/19: Vital stable this morning.  Mild hypokalemia with potassium of 3.4.  Preliminary blood cultures negative in 12 hours.  CBC with some improvement of leukocytosis to 13.2. Awaiting aspiration with IR.  Will continue current level of care and antibiotics.  2/20; hemodynamically  stable.  Right hip joint aspiration with IR yielded 16 mL of thick brown fluid, preliminary cultures with no organisms so far.  Cell counts with significant leukocytosis with WBC of  255, 000 with 91% neutrophil, more consistent with septic joint.  Negative crystals.  Leukocytosis improving. Going for joint washout with orthopedic surgery today.  Assessment and Plan: * Septic arthritis of hip ([place]) MRI findings worrisome for septic arthritis of R hip with possible communicating abscess (see above). However, I'm at all sure why this otherwise healthy 32 year old has septic arthritis of the hip, no obvious risk factors that I can determine, no IVDU, etc.  EGD and colonoscopy last Nov for chronic constipation was normal per reports (impression was just irritable bowel syndrome per GI report), pt is not on any chronic medications (no immunosuppressives specifically), and doesn't really have any known major chronic medical conditions. Joint aspirate done by IR yesterday with significantly elevated WBC, no organisms so far on culture.  Going for irrigation and debridement with orthopedic today Cont empiric zosyn + vanc F/u drainage cultures when available Get BCx if she runs a fever Morphine PRN pain HIV screen ordered Offered imaging of R knee given the HPI, but pt declines.   Subjective: Patient with some improvement in right hip pain but continued to have significant enough with ambulation when she required pain medications.  Awaiting procedure with orthopedic surgery.  Physical Exam: Vitals:   03/22/22 1359 03/22/22 1624 03/22/22 1639 03/22/22 1654  BP: 128/86 (!) 148/97 (!) 149/98 (!) 151/98  Pulse: (!) 104 (!) 104 (!) 105 (!) 105  Resp: 16  $19 19 20  Z$ Temp: 99 F (37.2 C) 97.8 F (36.6 C)  97.8 F (36.6 C)  TempSrc: Oral     SpO2: 96% 95% 94% 94%  Weight: 74.8 kg     Height: 5' 2"$  (1.575 m)      General.  Well-developed lady, in no acute distress. Pulmonary.  Lungs clear bilaterally,  normal respiratory effort. CV.  Regular rate and rhythm, no JVD, rub or murmur. Abdomen.  Soft, nontender, nondistended, BS positive. CNS.  Alert and oriented .  No focal neurologic deficit. Extremities.  No edema, no cyanosis, pulses intact and symmetrical. Psychiatry.  Judgment and insight appears normal.   Data Reviewed: Prior data reviewed  Family Communication: Sister at bedside  Disposition: Status is: Inpatient Remains inpatient appropriate because: Severity of illness  Planned Discharge Destination: Home  DVT prophylaxis.  Lovenox-holding for procedure Time spent: 42 minutes  This record has been created using Systems analyst. Errors have been sought and corrected,but may not always be located. Such creation errors do not reflect on the standard of care.   Author: Lorella Nimrod, MD 03/22/2022 5:06 PM  For on call review [email or web address].

## 2022-03-22 NOTE — Assessment & Plan Note (Signed)
MRI findings worrisome for septic arthritis of R hip with possible communicating abscess (see above). However, I'm at all sure why this otherwise healthy 32 year old has septic arthritis of the hip, no obvious risk factors that I can determine, no IVDU, etc.  EGD and colonoscopy last Nov for chronic constipation was normal per reports (impression was just irritable bowel syndrome per GI report), pt is not on any chronic medications (no immunosuppressives specifically), and doesn't really have any known major chronic medical conditions. Joint aspirate done by IR yesterday with significantly elevated WBC, no organisms so far on culture.  Going for irrigation and debridement with orthopedic today Cont empiric zosyn + vanc F/u drainage cultures when available Get BCx if she runs a fever Morphine PRN pain HIV screen ordered Offered imaging of R knee given the HPI, but pt declines.

## 2022-03-22 NOTE — Anesthesia Preprocedure Evaluation (Signed)
Anesthesia Evaluation  Patient identified by MRN, date of birth, ID band Patient awake    Reviewed: Allergy & Precautions, H&P , NPO status , Patient's Chart, lab work & pertinent test results  Airway Mallampati: II  TM Distance: >3 FB Neck ROM: Full    Dental no notable dental hx.    Pulmonary neg pulmonary ROS   Pulmonary exam normal breath sounds clear to auscultation       Cardiovascular negative cardio ROS Normal cardiovascular exam Rhythm:Regular Rate:Normal     Neuro/Psych negative neurological ROS  negative psych ROS   GI/Hepatic negative GI ROS, Neg liver ROS,,,  Endo/Other  negative endocrine ROS    Renal/GU negative Renal ROS  negative genitourinary   Musculoskeletal negative musculoskeletal ROS (+)    Abdominal   Peds negative pediatric ROS (+)  Hematology negative hematology ROS (+)   Anesthesia Other Findings   Reproductive/Obstetrics negative OB ROS                             Anesthesia Physical Anesthesia Plan  ASA: 2  Anesthesia Plan: General   Post-op Pain Management: Tylenol PO (pre-op)*   Induction: Intravenous  PONV Risk Score and Plan: 3 and Ondansetron, Dexamethasone, Midazolam and Treatment may vary due to age or medical condition  Airway Management Planned: Oral ETT  Additional Equipment:   Intra-op Plan:   Post-operative Plan: Extubation in OR  Informed Consent: I have reviewed the patients History and Physical, chart, labs and discussed the procedure including the risks, benefits and alternatives for the proposed anesthesia with the patient or authorized representative who has indicated his/her understanding and acceptance.     Dental advisory given  Plan Discussed with: CRNA and Surgeon  Anesthesia Plan Comments:        Anesthesia Quick Evaluation

## 2022-03-22 NOTE — Progress Notes (Signed)
Pt requested diet advanced beyond clear liquids.  Contacted hospitalist and she stated is ok but find out why surgery hasn't advanced her diet.  Gave saltines crackers and pt tolerated well.   Saw pt visitor bring in two dominos medium size pizzas with 3 people in room (including patient).

## 2022-03-23 ENCOUNTER — Encounter (HOSPITAL_COMMUNITY): Payer: Self-pay | Admitting: Orthopedic Surgery

## 2022-03-23 DIAGNOSIS — M009 Pyogenic arthritis, unspecified: Principal | ICD-10-CM

## 2022-03-23 DIAGNOSIS — M00851 Arthritis due to other bacteria, right hip: Secondary | ICD-10-CM

## 2022-03-23 DIAGNOSIS — M79651 Pain in right thigh: Secondary | ICD-10-CM

## 2022-03-23 LAB — BASIC METABOLIC PANEL
Anion gap: 10 (ref 5–15)
BUN: 11 mg/dL (ref 6–20)
CO2: 24 mmol/L (ref 22–32)
Calcium: 8.6 mg/dL — ABNORMAL LOW (ref 8.9–10.3)
Chloride: 101 mmol/L (ref 98–111)
Creatinine, Ser: 0.68 mg/dL (ref 0.44–1.00)
GFR, Estimated: >60 mL/min (ref >60)
Glucose, Bld: 131 mg/dL — ABNORMAL HIGH (ref 70–99)
Potassium: 3.9 mmol/L (ref 3.5–5.1)
Sodium: 135 mmol/L (ref 135–145)

## 2022-03-23 LAB — CBC
HCT: 33.7 % — ABNORMAL LOW (ref 36.0–46.0)
Hemoglobin: 11.1 g/dL — ABNORMAL LOW (ref 12.0–15.0)
MCH: 32.5 pg (ref 26.0–34.0)
MCHC: 32.9 g/dL (ref 30.0–36.0)
MCV: 98.5 fL (ref 80.0–100.0)
Platelets: 169 10*3/uL (ref 150–400)
RBC: 3.42 MIL/uL — ABNORMAL LOW (ref 3.87–5.11)
RDW: 12.8 % (ref 11.5–15.5)
WBC: 14.6 10*3/uL — ABNORMAL HIGH (ref 4.0–10.5)
nRBC: 0 % (ref 0.0–0.2)

## 2022-03-23 LAB — HEPATITIS C ANTIBODY: HCV Ab: NONREACTIVE

## 2022-03-23 MED ORDER — SODIUM CHLORIDE 0.9 % IV SOLN
2.0000 g | Freq: Every day | INTRAVENOUS | Status: DC
  Administered 2022-03-23 – 2022-03-27 (×5): 2 g via INTRAVENOUS
  Filled 2022-03-23 (×6): qty 20

## 2022-03-23 NOTE — Op Note (Signed)
03/22/2022  8:15 AM  PATIENT:  Kristen Robles    PRE-OPERATIVE DIAGNOSIS:  Septic right hip  POST-OPERATIVE DIAGNOSIS:  Same  PROCEDURE:  IRRIGATION AND DEBRIDEMENT RIGHT HIP  SURGEON:  [name], MD  ASSISTANT: Aggie Moats, PA-C, he was present and scrubbed throughout the case, critical for completion in a timely fashion, and for retraction, instrumentation, and closure.   ANESTHESIA:   gen  PREOPERATIVE INDICATIONS:  [name] is a  32 y.o. female with a diagnosis of Septic right hip who failed conservative measures and elected for surgical management.    The risks benefits and alternatives were discussed with the patient preoperatively including but not limited to the risks of infection, bleeding, nerve injury, cardiopulmonary complications, the need for revision surgery, among others, and the patient was willing to proceed.  OPERATIVE IMPLANTS: none  OPERATIVE FINDINGS: purulent fluid  BLOOD LOSS: 50  COMPLICATIONS: none  TOURNIQUET TIME: none  OPERATIVE PROCEDURE:  Patient was identified in the preoperative holding area and site was marked by me She was transported to the operating theater and placed on the table in supine position taking care to pad all bony prominences. After a preincinduction time out anesthesia was induced. The R hip was prepped and draped in normal sterile fashion and a pre-incision timeout was performed. She received ancef for preoperative antibiotics.   The right flank and anterior hip was prepped and draped in the normal sterile fashion after patient was placed on the Hana table pelvic table padding all bony prominences.  I made an anterior approach to the hip first incising just below the distal and lateral to the ASIS longitudinally.  I dissected to the tensor fascia muscle and incised the fascia over top of this muscle.  I bluntly dissected within the capsule of the muscle and retracted it laterally.  I was able to dissect  through the posterior capsule of the tensor and down to the anterior capsule of the hip.  Hemostasis was maintained  I open the anterior capsule of the hip purulent fluid was immediately expressed I collected synovium and anterior capsule for culture and sent this off.  I thoroughly irrigated the joint with multiple liters of saline  I then placed vancomycin powder  I closed the fascia of the tensor muscle and the skin in layers.  POST OPERATIVE PLAN: Weightbearing as tolerated DVT prophylaxis per primary team ID consult called

## 2022-03-23 NOTE — Progress Notes (Signed)
    Subjective: Patient reports pain as moderate. Better with medicine. Some decreased sensation to lateral thigh. Tolerating diet.  Urinating.   No CP, SOB.  Hasn't mobilized OOB with PT yet.  Objective:   VITALS:   Vitals:   03/22/22 1715 03/22/22 2023 03/23/22 0551 03/23/22 0733  BP: (!) 135/94 119/78 107/64 116/63  Pulse: (!) 103 91 (!) 57 (!) 57  Resp: 18 17 17 18  $ Temp: 98 F (36.7 C) 98 F (36.7 C) 97.6 F (36.4 C) 97.7 F (36.5 C)  TempSrc: Oral Oral Oral Oral  SpO2: 100% 96% 97% 98%  Weight:      Height:          Latest Ref Rng & Units 03/23/2022    6:27 AM 03/22/2022    4:07 AM 03/21/2022    4:59 AM  CBC  WBC 4.0 - 10.5 K/uL 14.6  12.7  13.2   Hemoglobin 12.0 - 15.0 g/dL 11.1  12.1  12.1   Hematocrit 36.0 - 46.0 % 33.7  35.2  36.6   Platelets 150 - 400 K/uL 169  157  152       Latest Ref Rng & Units 03/23/2022    6:27 AM 03/22/2022    4:07 AM 03/21/2022    4:59 AM  BMP  Glucose 70 - 99 mg/dL 131  128  137   BUN 6 - 20 mg/dL 11  6  8   $ Creatinine 0.44 - 1.00 mg/dL 0.68  0.78  0.79   Sodium 135 - 145 mmol/L 135  133  135   Potassium 3.5 - 5.1 mmol/L 3.9  3.4  3.4   Chloride 98 - 111 mmol/L 101  100  101   CO2 22 - 32 mmol/L 24  25  24   $ Calcium 8.9 - 10.3 mg/dL 8.6  8.8  8.8    Intake/Output      02/20 0701 02/21 0700 02/21 0701 02/22 0700   I.V. (mL/kg) 1400 (18.7)    IV Piggyback 989.6    Total Intake(mL/kg) 2389.6 (31.9)    Urine (mL/kg/hr) 0 (0)    Blood 10    Total Output 10    Net +2379.6         Urine Occurrence 1 x       Physical Exam: General: NAD.  Sitting up in bed, calm, comfortable Resp: No increased wob Cardio: regular rate and rhythm ABD soft Neurologically intact MSK Neurovascularly intact Sensation intact distally Intact pulses distally Dorsiflexion/Plantar flexion intact Incision: dressing C/D/I Compartments soft Minimal ecchymosis Moderate induration   Assessment: 1 Day Post-Op  S/P Procedure(s)  (LRB): IRRIGATION AND DEBRIDEMENT RIGHT HIP (Right) by Dr. Ernesta Amble. Percell Miller on 03/22/22  Principal Problem:   Septic arthritis of hip ([place])   Plan: Cultures from surgery so far show NGTD, will continue to follow  Advance diet Up with therapy Incentive Spirometry Elevate and Apply ice  Weightbearing: WBAT RLE Insicional and dressing care: Dressings left intact until follow-up and Reinforce dressings as needed Orthopedic device(s): None Showering: Keep dressing dry VTE prophylaxis: Lovenox 51m qd  while in patient , SCDs, ambulation Pain control: Tylenol, Norco, Tramadol, Dilaudid PRN Follow - up plan: 1 week post op Contact information:  TEdmonia LynchMD, MAggie MoatsPA-C  Dispo:  TBD     MBritt Bottom PVermontOffice [phone]/21/2024, 10:07 AM

## 2022-03-23 NOTE — Consult Note (Signed)
[facility] for Infectious Disease    Date of Admission:  03/20/2022     Reason for Consult: Right hip septic arthritis     Referring Physician: Dr Percell Miller (orthopedics)  Current antibiotics: Vancomycin 2/18--present Piperacillin/tazobactam 2/18--present    ASSESSMENT:    32 y.o. female admitted with:  Native right hip septic arthritis: Patient presenting with worsening right hip pain and found to have septic arthritis based on imaging and joint aspiration.  Status post I&D with orthopedics 03/22/22 and cultures pending.   RECOMMENDATIONS:    Continue vancomycin per pharmacy Change Zosyn to ceftriaxone 2 gm daily Follow cultures Check Lyme serologies although seems less likely Check GC/CT urine although also seems less likely Check HCV Ab Lab monitoring Will follow   Principal Problem:   Septic arthritis of hip ([facility])   MEDICATIONS:    Scheduled Meds:  acetaminophen  1,000 mg Oral Q6H   docusate sodium  100 mg Oral BID   enoxaparin (LOVENOX) injection  40 mg Subcutaneous Q24H   mupirocin ointment  1 Application Nasal BID   Continuous Infusions:  methocarbamol (ROBAXIN) IV     piperacillin-tazobactam (ZOSYN)  IV 3.375 g (03/23/22 0556)   vancomycin Stopped (03/22/22 2249)   PRN Meds:.diphenhydrAMINE, HYDROcodone-acetaminophen, HYDROmorphone (DILAUDID) injection, methocarbamol **OR** methocarbamol (ROBAXIN) IV, metoCLOPramide **OR** metoCLOPramide (REGLAN) injection, ondansetron **OR** ondansetron (ZOFRAN) IV, traMADol  HPI:    Kristen Robles is a 32 y.o. female with no significant past medical history who presented to the emergency department on 03/20/2022 with a chief complaint of right leg pain.  Patient had reported moving some heavy items on Friday.  She then drove in the car for 3 to 4 hours to [place] at which time she noticed some tightness in her leg after driving.  She woke up the next morning and was unable to bear weight with significant pain.  The  pain was located over her anterior/medial thigh and described as sharp and stabbing.  As noted, it was made worse by weightbearing and range of motion.  She has no prior history of significant joint complaints and no history of injection drug use or other high risk behaviors.  Upon presentation to the emergency department, she was found to have leukocytosis of 14.7.  CMP was relatively unremarkable.  Inflammatory markers were obtained and also not significantly abnormal with a CRP of 6.7 and ESR 17.  She underwent CT scan which showed an abnormal right hip joint effusion.  This was followed up with an MRI that showed right hip septic arthritis with surrounding inflammatory changes involving the abductor muscles with a peripherally enhancing fluid collection about the abductor muscles measuring 1.6 x 3.0 cm.  She had 1 set of blood cultures obtained prior to getting a dose of vancomycin and piperacillin/tazobactam.  A second set of blood cultures was obtained a few hours later.  Both of these sets are currently no growth.  Orthopedic surgery was consulted on 03/21/2022 in the morning.  They asked for IR to do an aspiration.  This was completed yesterday morning on 2/20 with a very abnormal cell count of 255,000 with 91% neutrophils.  Anaerobic cultures were sent which were negative on Gram stain and culture results pending.  She was then taken to the OR yesterday by orthopedic surgery for irrigation and debridement of the right hip.  Further cultures were obtained which are currently pending as well.  She has been maintained on piperacillin tazobactam and vancomycin.  We were consulted for further recommendations  on antibiotics.  She reports no history of IVDU or high risk behavior.  She lives in [place], [place] and works at [employer].  She is in a monogamous relationship with her partner.  She has children and dogs at home.  She does spend time outdoors but does not recall pulling any ticks off her specifically.  She  also reports that her right knee has been occasionally giving out on her at work but has not been particularly painful.  This was slightly worsened last week.    History reviewed. No pertinent past medical history.  Social History   Tobacco Use   Smoking status: Never  Substance Use Topics   Alcohol use: Yes    Alcohol/week: 1.0 standard drink of alcohol    Types: 1 Glasses of wine per week   Drug use: No    History reviewed. No pertinent family history.  Allergies  Allergen Reactions   Oxycodone-Acetaminophen Itching and Rash   Tomato Anaphylaxis and Hives    Review of Systems  All other systems reviewed and are negative.  Except as noted in the HPI.   OBJECTIVE:   Blood pressure 116/63, pulse (!) 57, temperature 97.7 F (36.5 C), temperature source Oral, resp. rate 18, height 5' 2"$  (1.575 m), weight 74.8 kg, SpO2 98 %. Body mass index is 30.18 kg/m.  Physical Exam Constitutional:      General: She is not in acute distress.    Appearance: Normal appearance.  HENT:     Head: Normocephalic and atraumatic.  Eyes:     Extraocular Movements: Extraocular movements intact.     Conjunctiva/sclera: Conjunctivae normal.  Cardiovascular:     Pulses: Normal pulses.  Pulmonary:     Effort: Pulmonary effort is normal. No respiratory distress.  Abdominal:     General: There is no distension.     Palpations: Abdomen is soft.  Musculoskeletal:     Cervical back: Normal range of motion and neck supple.     Comments: Incision covered overlying her right hip.  Right knee with no significant swelling or TTP.  Did not try to range her knee at this time.   Skin:    General: Skin is warm and dry.     Findings: No rash.  Neurological:     General: No focal deficit present.     Mental Status: She is alert and oriented to person, place, and time.  Psychiatric:        Mood and Affect: Mood normal.        Behavior: Behavior normal.      Lab Results: Lab Results  Component  Value Date   WBC 14.6 (H) 03/23/2022   HGB 11.1 (L) 03/23/2022   HCT 33.7 (L) 03/23/2022   MCV 98.5 03/23/2022   PLT 169 03/23/2022    Lab Results  Component Value Date   NA 133 (L) 03/22/2022   K 3.4 (L) 03/22/2022   CO2 25 03/22/2022   GLUCOSE 128 (H) 03/22/2022   BUN 6 03/22/2022   CREATININE 0.78 03/22/2022   CALCIUM 8.8 (L) 03/22/2022   GFRNONAA >60 03/22/2022   GFRAA >90 09/03/2011    Lab Results  Component Value Date   ALT 17 03/20/2022   AST 21 03/20/2022   ALKPHOS 43 03/20/2022   BILITOT 0.5 03/20/2022       Component Value Date/Time   CRP 6.7 (H) 03/20/2022 2007       Component Value Date/Time   ESRSEDRATE 17 03/20/2022 2007  I have reviewed the micro and lab results in Epic.  Imaging: DG FLUORO GUIDED NEEDLE PLC ASPIRATION/INJECTION LOC  Result Date: 03/21/2022 CLINICAL DATA:  32 year old female with acute onset right hip pain. Request made for right hip effusion aspiration. EXAM: RIGHT HIP ASPIRATION PROCEDURE: After a thorough discussion of risks and benefits of the procedure, written and oral informed consent was obtained. The consent discussion included the risk of bleeding, infection and injury to nerves and adjacent blood vessels. Extra-articular injection was also a possible risk discussed. Preliminary localization was performed over the right hip. The area was marked over the junction of the right femoral head and neck. After prep and drape in the usual sterile fashion, the skin and deeper subcutaneous tissues were anesthetized with 1% Lidocaine without Epinephrine. Under fluoroscopic guidance, an 18 gauge 3.5 inch spinal needle was advanced into the joint at the lateral margin of the junction of the femoral head and neck. Intra-articular aspiration was performed which flowed freely and subsequently 16 mL of thick brown fluid was removed. An end point was felt and procedure was discontinued, the needle removed, and a sterile dressing applied. The patient  tolerated the procedure well and there were no complications. FLUOROSCOPY: Radiation Exposure Index (as provided by the fluoroscopic device): 5.7 mGy Kerma IMPRESSION: Successful right hip fluoroscopically guided aspiration yielding 16 mL of thick, brown fluid. Fluid sent for requested testing. This exam was performed by Brynda Greathouse PA-C, and was supervised and interpreted by Zetta Bills, MD. Electronically Signed   By: Zetta Bills M.D.   On: 03/21/2022 17:16     Imaging independently reviewed in Epic.  Raynelle Highland for Infectious [facility] [phone] pager 03/23/2022, 7:38 AM  I have personally spent 60 minutes involved in face-to-face and non-face-to-face activities for this patient on the day of the visit. Professional time spent includes the following activities: Preparing to see the patient (review of tests), Obtaining and/or reviewing separately obtained history (admission/discharge record), Performing a medically appropriate examination and/or evaluation , Ordering medications/tests/procedures, referring and communicating with other health care professionals, Documenting clinical information in the EMR, Independently interpreting results (not separately reported), Communicating results to the patient/family/caregiver, Counseling and educating the patient/family/caregiver and Care coordination (not separately reported).

## 2022-03-23 NOTE — Progress Notes (Signed)
  Progress Note   Patient: Kristen Robles [id number] DOB: 02-14-1990 DOA: 03/20/2022     2 DOS: the patient was seen and examined on 03/23/2022   Brief hospital course: 32 y.o. female with medical history significant of IBS, abnormal uterine bleeding presented to ED with right leg pain.  She drove in the car for about 3 to 4 hours on Friday and noticed tightness in the leg after driving.  Pain mostly located over anterior/medial thigh and unable to bear weight.  Patient denies any unintentional weight loss or IV drug use.  Assessment and Plan: * Septic arthritis of hip ([place]) -MRI findings worrisome for septic arthritis of R hip with possible communicating abscess  -Orthopedic Surgery consulted and pt is now s/p irrigation and debridement of R hip 2/20 -ID consulted, recs to continue vanc with rocephin empirically for now, pending cultures  Leukocytosis -Likely secondary to presenting septic arthritis -Stable  Normocytic anemia, not POA -Hemodynamically stable -Recheck cbc in AM     Subjective: Without issues this AM  Physical Exam: Vitals:   03/22/22 1715 03/22/22 2023 03/23/22 0551 03/23/22 0733  BP: (!) 135/94 119/78 107/64 116/63  Pulse: (!) 103 91 (!) 57 (!) 57  Resp: 18 17 17 18  $ Temp: 98 F (36.7 C) 98 F (36.7 C) 97.6 F (36.4 C) 97.7 F (36.5 C)  TempSrc: Oral Oral Oral Oral  SpO2: 100% 96% 97% 98%  Weight:      Height:       General exam: Awake, laying in bed, in nad Respiratory system: Normal respiratory effort, no wheezing Cardiovascular system: regular rate, s1, s2 Gastrointestinal system: Soft, nondistended, positive BS Central nervous system: CN2-12 grossly intact, strength intact Extremities: Perfused, no clubbing Skin: Normal skin turgor, no notable skin lesions seen Psychiatry: Mood normal // no visual hallucinations   Data Reviewed:  Labs reviewed: Na135, K 3.9, Cr 0.68, WBC 14.6, Hgb 11.1   Family Communication: Pt in room, family at  bedside  Disposition: Status is: Inpatient Remains inpatient appropriate because: Severity of illness  Planned Discharge Destination: Home     Author: Marylu Lund, MD 03/23/2022 3:23 PM  For on call review [email or web address].

## 2022-03-23 NOTE — Evaluation (Signed)
Physical Therapy Evaluation Patient Details Name: Kristen Robles MRN: [id number] DOB: 1991-01-02 Today's Date: 03/23/2022  History of Present Illness  32 yo female presents to ED on 2/18 with RLE pain. S/p R hip I&D 2/20 for septic arthritis. PMH Includes IBS.  Clinical Impression  Pt presents with R hip pain, antalgic gait requiring use of RW, and decreased activity tolerance. Pt to benefit from acute PT to address deficits. Pt ambulated hallway distance with use of RW and close guard for safety, pt only needs physical assist when moving to/from EOB given RLE pain. PT anticipates pt will progress well mobility-wise with further pain management. PT to progress mobility as tolerated, and will continue to follow acutely.         Recommendations for follow up therapy are one component of a multi-disciplinary discharge planning process, led by the attending physician.  Recommendations may be updated based on patient status, additional functional criteria and insurance authorization.  Follow Up Recommendations No PT follow up (anticipate good progress)      Assistance Recommended at Discharge Set up Supervision/Assistance  Patient can return home with the following  A little help with walking and/or transfers;A little help with bathing/dressing/bathroom    Equipment Recommendations None recommended by PT (pt states she can borrow one from her grandmother)  Recommendations for Other Services       Functional Status Assessment Patient has had a recent decline in their functional status and demonstrates the ability to make significant improvements in function in a reasonable and predictable amount of time.     Precautions / Restrictions Precautions Precautions: Fall Restrictions Weight Bearing Restrictions: No      Mobility  Bed Mobility Overal bed mobility: Needs Assistance Bed Mobility: Supine to Sit, Sit to Supine     Supine to sit: Supervision, HOB elevated Sit to supine:  Min assist, HOB elevated   General bed mobility comments: assist for RLE lift back into bed, increased time and effort    Transfers Overall transfer level: Needs assistance Equipment used: Rolling walker (2 wheels) Transfers: Sit to/from Stand Sit to Stand: Supervision           General transfer comment: for safety, cues for correct hand placement when rising.    Ambulation/Gait Ambulation/Gait assistance: Supervision Gait Distance (Feet): 90 Feet Assistive device: Rolling walker (2 wheels) Gait Pattern/deviations: Step-through pattern, Decreased stride length, Antalgic Gait velocity: decr     General Gait Details: cues for upright posture, placement in RW  Stairs            Wheelchair Mobility    Modified Rankin (Stroke Patients Only)       Balance Overall balance assessment: Needs assistance Sitting-balance support: No upper extremity supported, Feet supported Sitting balance-Leahy Scale: Good     Standing balance support: Bilateral upper extremity supported, During functional activity Standing balance-Leahy Scale: Fair Standing balance comment: use of RW for offweighting RLE                             Pertinent Vitals/Pain Pain Assessment Pain Assessment: Faces Faces Pain Scale: Hurts little more Pain Location: R hip Pain Descriptors / Indicators: Sore Pain Intervention(s): Limited activity within patient's tolerance, Monitored during session, Repositioned    Home Living Family/patient expects to be discharged to:: Private residence Living Arrangements: Parent;Children Available Help at Discharge: Family Type of Home: House Home Access: Stairs to enter   [employer] of Steps: 1 onto the porch  Home Layout: One level Home Equipment: Conservation officer, nature (2 wheels)      Prior Function Prior Level of Function : Independent/Modified Independent                     Hand Dominance   Dominant Hand: Right     Extremity/Trunk Assessment   Upper Extremity Assessment Upper Extremity Assessment: Defer to OT evaluation    Lower Extremity Assessment Lower Extremity Assessment: RLE deficits/detail RLE Deficits / Details: limited by pain; able to perform LAQ with increased time, heel slide with limited ROM RLE: Unable to fully assess due to pain    Cervical / Trunk Assessment Cervical / Trunk Assessment: Normal  Communication   Communication: No difficulties  Cognition Arousal/Alertness: Awake/alert Behavior During Therapy: WFL for tasks assessed/performed Overall Cognitive Status: Within Functional Limits for tasks assessed                                          General Comments      Exercises General Exercises - Lower Extremity Long Arc Quad: AROM, Right, Seated (x1)   Assessment/Plan    PT Assessment Patient needs continued PT services  PT Problem List Decreased strength;Decreased mobility;Decreased range of motion;Decreased activity tolerance;Decreased balance;Pain;Decreased safety awareness;Decreased knowledge of use of DME       PT Treatment Interventions DME instruction;Therapeutic activities;Gait training;Therapeutic exercise;Patient/family education;Balance training;Stair training;Functional mobility training;Neuromuscular re-education    PT Goals (Current goals can be found in the Care Plan section)  Acute Rehab PT Goals PT Goal Formulation: With patient Time For Goal Achievement: 04/06/22 Potential to Achieve Goals: Good    Frequency Min 3X/week     Co-evaluation               AM-PAC PT "6 Clicks" Mobility  Outcome Measure Help needed turning from your back to your side while in a flat bed without using bedrails?: None Help needed moving from lying on your back to sitting on the side of a flat bed without using bedrails?: None Help needed moving to and from a bed to a chair (including a wheelchair)?: A Little Help needed standing up from  a chair using your arms (e.g., wheelchair or bedside chair)?: A Little Help needed to walk in hospital room?: A Little Help needed climbing 3-5 steps with a railing? : A Little 6 Click Score: 20    End of Session   Activity Tolerance: Patient tolerated treatment well Patient left: in bed;with call bell/phone within reach;with family/visitor present Nurse Communication: Mobility status PT Visit Diagnosis: Other abnormalities of gait and mobility (R26.89);Muscle weakness (generalized) (M62.81)    Time: [id number] PT Time Calculation (min) (ACUTE ONLY): 16 min   Charges:   PT Evaluation $PT Eval Low Complexity: 1 Low         Donella Pascarella S, PT DPT Acute Rehabilitation Services Pager [phone]  Office [phone]   Zoar E Ruffin Pyo 03/23/2022, 11:25 AM

## 2022-03-24 LAB — CBC
HCT: 32.3 % — ABNORMAL LOW (ref 36.0–46.0)
Hemoglobin: 10.8 g/dL — ABNORMAL LOW (ref 12.0–15.0)
MCH: 33 pg (ref 26.0–34.0)
MCHC: 33.4 g/dL (ref 30.0–36.0)
MCV: 98.8 fL (ref 80.0–100.0)
Platelets: 159 10*3/uL (ref 150–400)
RBC: 3.27 MIL/uL — ABNORMAL LOW (ref 3.87–5.11)
RDW: 12.8 % (ref 11.5–15.5)
WBC: 12.1 10*3/uL — ABNORMAL HIGH (ref 4.0–10.5)
nRBC: 0 % (ref 0.0–0.2)

## 2022-03-24 LAB — COMPREHENSIVE METABOLIC PANEL
ALT: 14 U/L (ref 0–44)
AST: 15 U/L (ref 15–41)
Albumin: 2.4 g/dL — ABNORMAL LOW (ref 3.5–5.0)
Alkaline Phosphatase: 38 U/L (ref 38–126)
Anion gap: 8 (ref 5–15)
BUN: 14 mg/dL (ref 6–20)
CO2: 26 mmol/L (ref 22–32)
Calcium: 8.4 mg/dL — ABNORMAL LOW (ref 8.9–10.3)
Chloride: 103 mmol/L (ref 98–111)
Creatinine, Ser: 0.66 mg/dL (ref 0.44–1.00)
GFR, Estimated: >60 mL/min (ref >60)
Glucose, Bld: 103 mg/dL — ABNORMAL HIGH (ref 70–99)
Potassium: 3.3 mmol/L — ABNORMAL LOW (ref 3.5–5.1)
Sodium: 137 mmol/L (ref 135–145)
Total Bilirubin: 0.6 mg/dL (ref 0.3–1.2)
Total Protein: 5.5 g/dL — ABNORMAL LOW (ref 6.5–8.1)

## 2022-03-24 LAB — GC/CHLAMYDIA PROBE AMP (~~LOC~~) NOT AT ARMC
Chlamydia: NEGATIVE
Comment: NEGATIVE
Comment: NORMAL
Neisseria Gonorrhea: NEGATIVE

## 2022-03-24 LAB — LYME DISEASE SEROLOGY W/REFLEX: Lyme Total Antibody EIA: NEGATIVE

## 2022-03-24 MED ORDER — POTASSIUM CHLORIDE CRYS ER 20 MEQ PO TBCR
60.0000 meq | EXTENDED_RELEASE_TABLET | Freq: Once | ORAL | Status: AC
  Administered 2022-03-24: 60 meq via ORAL
  Filled 2022-03-24: qty 3

## 2022-03-24 MED ORDER — PANTOPRAZOLE SODIUM 40 MG PO TBEC
40.0000 mg | DELAYED_RELEASE_TABLET | Freq: Every day | ORAL | Status: DC
  Administered 2022-03-24 – 2022-03-28 (×5): 40 mg via ORAL
  Filled 2022-03-24 (×5): qty 1

## 2022-03-24 MED ORDER — CALCIUM CARBONATE ANTACID 500 MG PO CHEW
1.0000 | CHEWABLE_TABLET | Freq: Three times a day (TID) | ORAL | Status: DC | PRN
  Administered 2022-03-24: 200 mg via ORAL
  Filled 2022-03-24: qty 1

## 2022-03-24 NOTE — Progress Notes (Signed)
Mobility Specialist - Progress Note   03/24/22 1109  Mobility  Activity Ambulated with assistance in room  Level of Assistance Standby assist, set-up cues, supervision of patient - no hands on  Assistive Device Front wheel walker  Distance Ambulated (ft) 20 ft  Activity Response Tolerated well  Mobility Referral Yes  $Mobility charge 1 Mobility   Pt was received in bed and agreeable to mobility. No complaints throughout session. Pt was returned to EOB with all needs met.  Franki Monte  Mobility Specialist Please contact via Solicitor or Rehab office at [phone]

## 2022-03-24 NOTE — Progress Notes (Signed)
[facility] for Infectious Disease  Date of Admission:  03/20/2022           Reason for visit: Follow up on right hip septic arthritis  Current antibiotics: Vancomycin Ceftriaxone   ASSESSMENT:    32 y.o. female admitted with:  Native right hip septic arthritis: Patient presenting with worsening right hip pain and found to have septic arthritis based on imaging and joint aspiration.  Status post I&D with orthopedics 03/22/22 and cultures pending.    RECOMMENDATIONS:    Continue vancomycin and ceftriaxone Follow up GC/CT cytology Lab monitoring Will follow   Principal Problem:   Septic arthritis of hip (H[facility])    MEDICATIONS:    Scheduled Meds:  docusate sodium  100 mg Oral BID   enoxaparin (LOVENOX) injection  40 mg Subcutaneous Q24H   mupirocin ointment  1 Application Nasal BID   Continuous Infusions:  cefTRIAXone (ROCEPHIN)  IV 2 g (03/23/22 1439)   methocarbamol (ROBAXIN) IV     vancomycin 1,000 mg (03/23/22 2128)   PRN Meds:.diphenhydrAMINE, HYDROcodone-acetaminophen, HYDROmorphone (DILAUDID) injection, methocarbamol **OR** methocarbamol (ROBAXIN) IV, metoCLOPramide **OR** metoCLOPramide (REGLAN) injection, ondansetron **OR** ondansetron (ZOFRAN) IV, traMADol  SUBJECTIVE:   24 hour events:  No acute events noted overnight Afebrile WBC improved Other labs stable No new imaging Lyme and GC/CT labs pending All other cultures negative thus far   She is currently on the phone with customer service regarding a disputed payment.   Review of Systems  All other systems reviewed and are negative.     OBJECTIVE:   Blood pressure (!) 109/59, pulse 86, temperature 98.4 F (36.9 C), temperature source Oral, resp. rate 17, height 5' 2"$  (1.575 m), weight 74.8 kg, SpO2 98 %. Body mass index is 30.18 kg/m.  Physical Exam Constitutional:      General: She is not in acute distress.    Comments: Sitting up in bed, on the telephone.  HENT:     Head:  Normocephalic and atraumatic.  Eyes:     Extraocular Movements: Extraocular movements intact.     Conjunctiva/sclera: Conjunctivae normal.  Musculoskeletal:     Cervical back: Normal range of motion.  Neurological:     General: No focal deficit present.     Mental Status: Mental status is at baseline.  Psychiatric:        Mood and Affect: Mood normal.        Behavior: Behavior normal.      Lab Results: Lab Results  Component Value Date   WBC 12.1 (H) 03/24/2022   HGB 10.8 (L) 03/24/2022   HCT 32.3 (L) 03/24/2022   MCV 98.8 03/24/2022   PLT 159 03/24/2022    Lab Results  Component Value Date   NA 137 03/24/2022   K 3.3 (L) 03/24/2022   CO2 26 03/24/2022   GLUCOSE 103 (H) 03/24/2022   BUN 14 03/24/2022   CREATININE 0.66 03/24/2022   CALCIUM 8.4 (L) 03/24/2022   GFRNONAA >60 03/24/2022   GFRAA >90 09/03/2011    Lab Results  Component Value Date   ALT 14 03/24/2022   AST 15 03/24/2022   ALKPHOS 38 03/24/2022   BILITOT 0.6 03/24/2022       Component Value Date/Time   CRP 6.7 (H) 03/20/2022 2007       Component Value Date/Time   ESRSEDRATE 17 03/20/2022 2007     I have reviewed the micro and lab results in [facility].  Imaging: No results found.   Imaging independently  reviewed in Epic.    Raynelle Highland for Infectious Disease [facility] [phone] pager 03/24/2022, 8:34 AM

## 2022-03-24 NOTE — Progress Notes (Signed)
    Subjective: Patient reports pain as moderate. Better with medicine. Some decreased sensation to lateral thigh. Tolerating diet.  Urinating.   No CP, SOB.  Has mobilized OOB with PT.  Objective:   VITALS:   Vitals:   03/24/22 0741 03/24/22 2036 03/25/22 0243 03/25/22 0837  BP: (!) 109/59 126/76 107/66 113/74  Pulse: 86 (!) 106 90 98  Resp: 17 14 18   $ Temp: 98.4 F (36.9 C) 99.4 F (37.4 C) 98.7 F (37.1 C) 99.4 F (37.4 C)  TempSrc: Oral Oral Oral   SpO2: 98% 99% 93% 96%  Weight:      Height:          Latest Ref Rng & Units 03/25/2022   12:08 AM 03/24/2022    5:52 AM 03/23/2022    6:27 AM  CBC  WBC 4.0 - 10.5 K/uL 11.3  12.1  14.6   Hemoglobin 12.0 - 15.0 g/dL 10.8  10.8  11.1   Hematocrit 36.0 - 46.0 % 32.7  32.3  33.7   Platelets 150 - 400 K/uL 184  159  169       Latest Ref Rng & Units 03/25/2022   12:08 AM 03/24/2022    5:52 AM 03/23/2022    6:27 AM  BMP  Glucose 70 - 99 mg/dL 101  103  131   BUN 6 - 20 mg/dL 5  14  11   $ Creatinine 0.44 - 1.00 mg/dL 0.69  0.66  0.68   Sodium 135 - 145 mmol/L 134  137  135   Potassium 3.5 - 5.1 mmol/L 3.7  3.3  3.9   Chloride 98 - 111 mmol/L 100  103  101   CO2 22 - 32 mmol/L 27  26  24   $ Calcium 8.9 - 10.3 mg/dL 8.6  8.4  8.6    Intake/Output    None       Assessment: 3 Days Post-Op  S/P Procedure(s) (LRB): IRRIGATION AND DEBRIDEMENT RIGHT HIP (Right) by Dr. Ernesta Amble. Percell Miller on 03/22/22  Principal Problem:   Septic arthritis of hip ([place])   Plan: Cultures from surgery so far show NGTD, will continue to follow  Advance diet Up with therapy Incentive Spirometry Elevate and Apply ice  Weightbearing: WBAT RLE, no ROM restrictions Insicional and dressing care: Dressings left intact until follow-up and Reinforce dressings as needed Orthopedic device(s): None Showering: Keep dressing dry VTE prophylaxis: Lovenox 31m qd  while in patient , SCDs, ambulation Pain control: Tylenol, Norco, Tramadol, Dilaudid  PRN Follow - up plan: 1 week post op Contact information:  TEdmonia LynchMD, MAggie MoatsPA-C  Dispo:  per primary. Ok for d/c from orthopedic standpoint at any time.     MBritt Bottom PA-C Office [phone]/23/2024, 12:47 PM

## 2022-03-24 NOTE — Progress Notes (Signed)
Physical Therapy Treatment Patient Details Name: Kristen Robles MRN: [id number] DOB: 1990-05-21 Today's Date: 03/24/2022   History of Present Illness 32 yo female presents to ED on 2/18 with RLE pain. S/p R hip I&D 2/20 for septic arthritis. PMH Includes IBS.    PT Comments    Pt reporting increased R thigh pain this date, more antalgic gait appreciated today vs on eval. Pt benefits from use of RW to offweight RLE. PT instructed pt in LE exercises to maintain strength and ROM, pt with limited tolerance. PT to continue to follow.     Recommendations for follow up therapy are one component of a multi-disciplinary discharge planning process, led by the attending physician.  Recommendations may be updated based on patient status, additional functional criteria and insurance authorization.  Follow Up Recommendations  No PT follow up (vs OPPT once d/c home)     Assistance Recommended at Discharge Set up Supervision/Assistance  Patient can return home with the following A little help with walking and/or transfers;A little help with bathing/dressing/bathroom   Equipment Recommendations  None recommended by PT (pt states she can borrow one from her grandmother)    Recommendations for Other Services       Precautions / Restrictions Precautions Precautions: Fall Restrictions Weight Bearing Restrictions: No     Mobility  Bed Mobility Overal bed mobility: Needs Assistance Bed Mobility: Supine to Sit, Sit to Supine     Supine to sit: Supervision, HOB elevated Sit to supine: Min assist, HOB elevated   General bed mobility comments: assist for RLE lift back into bed, increased time and effort    Transfers Overall transfer level: Needs assistance Equipment used: Rolling walker (2 wheels) Transfers: Sit to/from Stand Sit to Stand: Supervision           General transfer comment: for safety    Ambulation/Gait Ambulation/Gait assistance: Supervision Gait Distance (Feet):  80 Feet Assistive device: Rolling walker (2 wheels) Gait Pattern/deviations: Step-through pattern, Decreased stride length, Antalgic Gait velocity: decr     General Gait Details: very antalgic gait today, limited distance given burning and throbbing pain   Stairs             Wheelchair Mobility    Modified Rankin (Stroke Patients Only)       Balance Overall balance assessment: Needs assistance Sitting-balance support: No upper extremity supported, Feet supported Sitting balance-Leahy Scale: Good     Standing balance support: Bilateral upper extremity supported, During functional activity Standing balance-Leahy Scale: Fair Standing balance comment: use of RW for offweighting RLE                            Cognition Arousal/Alertness: Awake/alert Behavior During Therapy: WFL for tasks assessed/performed Overall Cognitive Status: Within Functional Limits for tasks assessed                                          Exercises General Exercises - Lower Extremity Ankle Circles/Pumps: AROM, Both, 5 reps, Supine Quad Sets: AROM, Both, 5 reps, Supine    General Comments        Pertinent Vitals/Pain Pain Assessment Pain Assessment: Faces Faces Pain Scale: Hurts little more Pain Location: R hip Pain Descriptors / Indicators: Sore Pain Intervention(s): Limited activity within patient's tolerance, Monitored during session, Repositioned, Patient requesting pain meds-RN notified    Home Living  Prior Function            PT Goals (current goals can now be found in the care plan section) Acute Rehab PT Goals PT Goal Formulation: With patient Time For Goal Achievement: 04/06/22 Potential to Achieve Goals: Good Progress towards PT goals: Progressing toward goals    Frequency    Min 3X/week      PT Plan Current plan remains appropriate    Co-evaluation              AM-PAC PT "6 Clicks"  Mobility   Outcome Measure  Help needed turning from your back to your side while in a flat bed without using bedrails?: None Help needed moving from lying on your back to sitting on the side of a flat bed without using bedrails?: None Help needed moving to and from a bed to a chair (including a wheelchair)?: A Little Help needed standing up from a chair using your arms (e.g., wheelchair or bedside chair)?: A Little Help needed to walk in hospital room?: A Little Help needed climbing 3-5 steps with a railing? : A Little 6 Click Score: 20    End of Session   Activity Tolerance: Patient tolerated treatment well Patient left: in bed;with call bell/phone within reach;with family/visitor present Nurse Communication: Mobility status PT Visit Diagnosis: Other abnormalities of gait and mobility (R26.89);Muscle weakness (generalized) (M62.81)     Time: 1250-1305 PT Time Calculation (min) (ACUTE ONLY): 15 min  Charges:  $Therapeutic Activity: 8-22 mins                     Stacie Glaze, PT DPT Acute Rehabilitation Services Pager [phone]  Office [phone]    Louis Matte 03/24/2022, 2:44 PM

## 2022-03-24 NOTE — Plan of Care (Signed)

## 2022-03-24 NOTE — Progress Notes (Signed)
  Progress Note   Patient: Kristen Robles [id number] DOB: 27-May-1990 DOA: 03/20/2022     3 DOS: the patient was seen and examined on 03/24/2022   Brief hospital course: 32 y.o. female with medical history significant of IBS, abnormal uterine bleeding presented to ED with right leg pain.  She drove in the car for about 3 to 4 hours on Friday and noticed tightness in the leg after driving.  Pain mostly located over anterior/medial thigh and unable to bear weight.  Patient denies any unintentional weight loss or IV drug use.  Assessment and Plan: * Septic arthritis of hip ([place]) -MRI findings worrisome for septic arthritis of R hip with possible communicating abscess  -Orthopedic Surgery consulted and pt is now s/p irrigation and debridement of R hip 2/20 -ID consulted, recs to continue vanc with rocephin empirically for now, awaiting cultures  Leukocytosis -Likely secondary to presenting septic arthritis -Stable  Normocytic anemia, not POA -Hemodynamically stable -Recheck cbc in AM  GERD -Will start protonix -Order PRN tums     Subjective: Without issues this AM  Physical Exam: Vitals:   03/23/22 1538 03/23/22 1935 03/24/22 0531 03/24/22 0741  BP: 115/70 131/73 115/68 (!) 109/59  Pulse: 76 81 86 86  Resp: 18 16 17 17  $ Temp: 98.6 F (37 C) 98 F (36.7 C) 98.7 F (37.1 C) 98.4 F (36.9 C)  TempSrc: Oral Oral Oral Oral  SpO2: 96% 99% 98% 98%  Weight:      Height:       General exam: Conversant, in no acute distress Respiratory system: normal chest rise, clear, no audible wheezing Cardiovascular system: regular rhythm, s1-s2 Gastrointestinal system: Nondistended, nontender, pos BS Central nervous system: No seizures, no tremors Extremities: No cyanosis, no joint deformities Skin: No rashes, no pallor Psychiatry: Affect normal // no auditory hallucinations   Data Reviewed:  Labs reviewed: Na 137, K 3.3, Cr 0.66, WBC 12.1, Hgb 10.8   Family Communication: Pt in  room, family at bedside  Disposition: Status is: Inpatient Remains inpatient appropriate because: Severity of illness  Planned Discharge Destination: Home     Author: Marylu Lund, MD 03/24/2022 2:22 PM  For on call review [email or web address].

## 2022-03-24 NOTE — Progress Notes (Signed)
Pharmacy Antibiotic Note  Kristen Robles is a 32 y.o. female admitted on 03/20/2022 with septic arthritis.  Pharmacy has been consulted for vancomycin and Zosyn dosing.  Renal function stable, afebrile, WBC down to 12.1  Plan: Vanc 1gm IV Q12H for AUC 525 using SCr 0.8 CTX 2gm IV Q24H per ID Monitor renal fxn, clinical progress, vanc levels soon if still on therapy  Height: 5' 2"$  (157.5 cm) Weight: 74.8 kg (165 lb) IBW/kg (Calculated) : 50.1  Temp (24hrs), Avg:98.4 F (36.9 C), Min:98 F (36.7 C), Max:98.7 F (37.1 C)  Recent Labs  Lab 03/20/22 1242 03/20/22 2038 03/21/22 0459 03/22/22 0407 03/23/22 0627 03/24/22 0552  WBC 14.7*  --  13.2* 12.7* 14.6* 12.1*  CREATININE 0.69  --  0.79 0.78 0.68 0.66  LATICACIDVEN  --  1.0 0.8  --   --   --      Estimated Creatinine Clearance: 96.5 mL/min (by C-G formula based on SCr of 0.66 mg/dL).    Allergies  Allergen Reactions   Oxycodone-Acetaminophen Itching and Rash   Tomato Anaphylaxis and Hives    Vanc 2/18 >> Zosyn 2/18 >> 2/21 CTX 2/21 >>  2/18 BCx - NGTD 2/19 BCx - NGTD 2/19 R hip synovial - NGTD 2/20 R hip tissue - NGTD 2/20 MRSA PCR - positive  Amelia Macken D. Mina Marble, PharmD, BCPS, [place] 03/24/2022, 2:38 PM

## 2022-03-25 ENCOUNTER — Other Ambulatory Visit (HOSPITAL_COMMUNITY): Payer: Self-pay

## 2022-03-25 DIAGNOSIS — A419 Sepsis, unspecified organism: Secondary | ICD-10-CM

## 2022-03-25 LAB — COMPREHENSIVE METABOLIC PANEL
ALT: 14 U/L (ref 0–44)
AST: 20 U/L (ref 15–41)
Albumin: 2.6 g/dL — ABNORMAL LOW (ref 3.5–5.0)
Alkaline Phosphatase: 35 U/L — ABNORMAL LOW (ref 38–126)
Anion gap: 7 (ref 5–15)
BUN: 5 mg/dL — ABNORMAL LOW (ref 6–20)
CO2: 27 mmol/L (ref 22–32)
Calcium: 8.6 mg/dL — ABNORMAL LOW (ref 8.9–10.3)
Chloride: 100 mmol/L (ref 98–111)
Creatinine, Ser: 0.69 mg/dL (ref 0.44–1.00)
GFR, Estimated: >60 mL/min (ref >60)
Glucose, Bld: 101 mg/dL — ABNORMAL HIGH (ref 70–99)
Potassium: 3.7 mmol/L (ref 3.5–5.1)
Sodium: 134 mmol/L — ABNORMAL LOW (ref 135–145)
Total Bilirubin: 0.7 mg/dL (ref 0.3–1.2)
Total Protein: 5.8 g/dL — ABNORMAL LOW (ref 6.5–8.1)

## 2022-03-25 LAB — CBC
HCT: 32.7 % — ABNORMAL LOW (ref 36.0–46.0)
Hemoglobin: 10.8 g/dL — ABNORMAL LOW (ref 12.0–15.0)
MCH: 32.7 pg (ref 26.0–34.0)
MCHC: 33 g/dL (ref 30.0–36.0)
MCV: 99.1 fL (ref 80.0–100.0)
Platelets: 184 10*3/uL (ref 150–400)
RBC: 3.3 MIL/uL — ABNORMAL LOW (ref 3.87–5.11)
RDW: 13 % (ref 11.5–15.5)
WBC: 11.3 10*3/uL — ABNORMAL HIGH (ref 4.0–10.5)
nRBC: 0 % (ref 0.0–0.2)

## 2022-03-25 LAB — CULTURE, BLOOD (ROUTINE X 2)
Culture: NO GROWTH
Special Requests: ADEQUATE

## 2022-03-25 LAB — VANCOMYCIN, TROUGH: Vancomycin Tr: 7 ug/mL — ABNORMAL LOW (ref 15–20)

## 2022-03-25 LAB — VANCOMYCIN, PEAK: Vancomycin Pk: 27 ug/mL — ABNORMAL LOW (ref 30–40)

## 2022-03-25 NOTE — Anesthesia Postprocedure Evaluation (Signed)
Anesthesia Post Note  Patient: Kristen Robles  Procedure(s) Performed: IRRIGATION AND DEBRIDEMENT RIGHT HIP (Right: Hip)     Patient location during evaluation: PACU Anesthesia Type: General Level of consciousness: awake and sedated Pain management: pain level controlled Vital Signs Assessment: post-procedure vital signs reviewed and stable Respiratory status: spontaneous breathing Cardiovascular status: bradycardic Postop Assessment: no apparent nausea or vomiting Anesthetic complications: no  No notable events documented.  Last Vitals:  Vitals:   03/25/22 0837 03/25/22 1707  BP: 113/74 115/66  Pulse: 98 (!) 102  Resp:    Temp: 37.4 C 37.1 C  SpO2: 96% 99%    Last Pain:  Vitals:   03/25/22 1234  TempSrc:   PainSc: 10-Worst pain ever   Pain Goal: Patients Stated Pain Goal: 2 (03/23/22 0830)                 Huston Foley

## 2022-03-25 NOTE — TOC Initial Note (Signed)
Transition of Care [facility]) - Initial/Assessment Note    Patient Details  Name: Kristen Robles MRN: [id number] Date of Birth: 09/25/1990  Transition of Care [facility]) CM/SW Contact:    Curlene Labrum, RN Phone Number: 03/25/2022, 3:03 PM  Clinical Narrative:                 CM met with the patient and mother at the bedside to discuss transitions of care needs.  The patient plans to discharge home to [place], [place] with her mother when she is discharged home from the hospital.  The patient has RW at home that is her grandmother's that she plans to use.  PCP was set up for hospital follow up in [place] with Dr. Enis Slipper through [facility] system and they have access to [facility] system for records.  PCP appointment is April 04, 2022 at 1:30 pm and family is aware.  The patient states that her Medicaid ran out and is no longer in place.  She plans to follow up with Medicaid office in [facility] to have this restarted.  Transportation - the patient's mother plans to driver her back home when she is discharged by car.  Expected Discharge Plan: Home/Self Care Barriers to Discharge: Continued Medical Work up   Patient Goals and CMS Choice Patient states their goals for this hospitalization and ongoing recovery are:: Wants to return home CMS Medicare.gov Compare Post Acute Care list provided to:: Patient Choice offered to / list presented to : Patient [place] ownership interest in [facility].provided to:: Patient    Expected Discharge Plan and Services       Living arrangements for the past 2 months: Single Family Home                                      Prior Living Arrangements/Services Living arrangements for the past 2 months: Single Family Home Lives with:: Parents (Lives with mother at the home)   Do you feel safe going back to the place where you live?: Yes               Activities of Daily Living Home Assistive Devices/Equipment:  None ADL Screening (condition at time of admission) Patient's cognitive ability adequate to safely complete daily activities?: Yes Is the patient deaf or have difficulty hearing?: No Does the patient have difficulty seeing, even when wearing glasses/contacts?: No Does the patient have difficulty concentrating, remembering, or making decisions?: No Patient able to express need for assistance with ADLs?: Yes Does the patient have difficulty dressing or bathing?: No Independently performs ADLs?: Yes (appropriate for developmental age) Does the patient have difficulty walking or climbing stairs?: Yes Weakness of Legs: Right Weakness of Arms/Hands: None  Permission Sought/Granted Permission sought to share information with : Case Manager, Family Supports, PCP Permission granted to share information with : Yes, Verbal Permission Granted     Permission granted to share info w AGENCY: Set up with PCP in [place], [place]        Emotional Assessment              Admission diagnosis:  Septic hip ([place]) [M00.9] Septic arthritis of hip ([place]) [M00.9] Acute pain of right thigh [M79.651] Patient Active Problem List   Diagnosis Date Noted   Septic arthritis of hip ([place]) 03/21/2022   PCP:  No Pcp, Per Patient (Inactive) Pharmacy:   [id number]  [place] [address] [place] [address] Phone: [phone] Fax: [phone]  [place] [facility] - [place], [place] - [id number] [place] [address] Phone: [phone] Fax: [phone]     Social Determinants of Health ([place]) Social History: [place]: No Food Insecurity (03/21/2022)  Housing: Low Risk  (03/21/2022)  Transportation Needs: No Transportation Needs (03/21/2022)  Utilities: Not At Risk (03/21/2022)  Tobacco Use: Unknown (03/23/2022)   SDOH Interventions:     Readmission Risk Interventions     No data to  display

## 2022-03-25 NOTE — Progress Notes (Signed)
Pharmacy Antibiotic Note  Kristen Robles is a 32 y.o. female admitted on 03/20/2022 with septic arthritis.  Pharmacy has been consulted for vancomycin and Zosyn dosing.  Renal function stable, afebrile, WBC down to 12.1>>11.3  Vancomycin Peak 27, Vancomycin trough 7 (true trough 8.7). Calculated AUC is 441 (goal 400-600). Continue current dosing  Plan: Continue Vanc 1gm IV Q12H  CTX 2gm IV Q24H per ID Monitor renal fxn, clinical progress, vanc levels weekly  Height: '5\' 2"'$  (157.5 cm) Weight: 74.8 kg (165 lb) IBW/kg (Calculated) : 50.1  Temp (24hrs), Avg:99.2 F (37.3 C), Min:98.7 F (37.1 C), Max:99.4 F (37.4 C)  Recent Labs  Lab 03/20/22 2038 03/21/22 0459 03/22/22 0407 03/23/22 0627 03/24/22 0552 03/25/22 0008 03/25/22 1116  WBC  --  13.2* 12.7* 14.6* 12.1* 11.3*  --   CREATININE  --  0.79 0.78 0.68 0.66 0.69  --   LATICACIDVEN 1.0 0.8  --   --   --   --   --   VANCOTROUGH  --   --   --   --   --   --  7*  VANCOPEAK  --   --   --   --   --  27*  --      Estimated Creatinine Clearance: 96.5 mL/min (by C-G formula based on SCr of 0.69 mg/dL).    Allergies  Allergen Reactions   Oxycodone-Acetaminophen Itching and Rash   Tomato Anaphylaxis and Hives    Vanc 2/18 >> Zosyn 2/18 >> 2/21 CTX 2/21 >>  2/18 BCx - NG 2/19 BCx - NG 2/19 R hip synovial - NG 2/20 R hip tissue - NG 2/20 MRSA PCR - negative, but Staph aureus positive  Charlet Harr A. Levada Dy, PharmD, BCPS, [facility] Clinical Pharmacist  Please utilize Amion for appropriate phone number to reach the unit pharmacist ([place])  03/25/2022, 12:55 PM

## 2022-03-25 NOTE — Progress Notes (Signed)
[facility] for Infectious Disease  Date of Admission:  03/20/2022           eason for visit: Follow up on right hip septic arthritis   Current antibiotics: Vancomycin Ceftriaxone   ASSESSMENT:    32 y.o. female admitted with:  Native right hip septic arthritis: Patient presenting with worsening right hip pain and found to have septic arthritis based on imaging and joint aspiration.  Status post I&D with orthopedics 03/22/22 and cultures currently no growth to date.   RECOMMENDATIONS:    Continue vancomycin Continue ceftriaxone Lab monitoring Will follow.  Anticipate continuing IV antibiotics through the weekend with possible transition to p.o. therapy at discharge Dr. Candiss Norse available as needed over the weekend.  I will return on Monday   Principal Problem:   Septic arthritis of hip (HCC)    MEDICATIONS:    Scheduled Meds:  docusate sodium  100 mg Oral BID   enoxaparin (LOVENOX) injection  40 mg Subcutaneous Q24H   mupirocin ointment  1 Application Nasal BID   pantoprazole  40 mg Oral Daily   Continuous Infusions:  cefTRIAXone (ROCEPHIN)  IV 2 g (03/24/22 0840)   methocarbamol (ROBAXIN) IV     vancomycin Stopped (03/24/22 2230)   PRN Meds:.calcium carbonate, diphenhydrAMINE, HYDROcodone-acetaminophen, HYDROmorphone (DILAUDID) injection, methocarbamol **OR** methocarbamol (ROBAXIN) IV, metoCLOPramide **OR** metoCLOPramide (REGLAN) injection, ondansetron **OR** ondansetron (ZOFRAN) IV, traMADol  SUBJECTIVE:   24 hour events:  No acute events noted overnight Continues on vancomycin and ceftriaxone Afebrile WBC improving No new imaging Surgical cultures remain no growth Blood cultures also no growth  She is complaining of thigh pain today.  Her mother is at the bedside.  She reports her plan is to return to [place], [place] after discharge.  Review of Systems  All other systems reviewed and are negative.     OBJECTIVE:   Blood pressure 107/66, pulse  90, temperature 98.7 F (37.1 C), temperature source Oral, resp. rate 18, height '5\' 2"'$  (1.575 m), weight 74.8 kg, SpO2 93 %. Body mass index is 30.18 kg/m.  Physical Exam Constitutional:      Comments: She is lying in bed.  She appears mildly uncomfortable.  HENT:     Head: Normocephalic and atraumatic.  Eyes:     Extraocular Movements: Extraocular movements intact.     Conjunctiva/sclera: Conjunctivae normal.  Abdominal:     General: There is no distension.     Palpations: Abdomen is soft.  Musculoskeletal:     Cervical back: Normal range of motion and neck supple.     Comments: Right hip incision is covered with a clean bandage.  Her anterior thigh has no redness or obvious swelling  Skin:    General: Skin is warm and dry.     Findings: No rash.  Neurological:     General: No focal deficit present.     Mental Status: She is oriented to person, place, and time.  Psychiatric:        Mood and Affect: Mood normal.        Behavior: Behavior normal.      Lab Results: Lab Results  Component Value Date   WBC 11.3 (H) 03/25/2022   HGB 10.8 (L) 03/25/2022   HCT 32.7 (L) 03/25/2022   MCV 99.1 03/25/2022   PLT 184 03/25/2022    Lab Results  Component Value Date   NA 134 (L) 03/25/2022   K 3.7 03/25/2022   CO2 27 03/25/2022   GLUCOSE 101 (  H) 03/25/2022   BUN 5 (L) 03/25/2022   CREATININE 0.69 03/25/2022   CALCIUM 8.6 (L) 03/25/2022   GFRNONAA >60 03/25/2022   GFRAA >90 09/03/2011    Lab Results  Component Value Date   ALT 14 03/25/2022   AST 20 03/25/2022   ALKPHOS 35 (L) 03/25/2022   BILITOT 0.7 03/25/2022       Component Value Date/Time   CRP 6.7 (H) 03/20/2022 2007       Component Value Date/Time   ESRSEDRATE 17 03/20/2022 2007     I have reviewed the micro and lab results in [place].  Imaging: No results found.   Imaging independently reviewed in Epic.    Raynelle [place] for Infectious Disease [facility]  Group [phone] pager 03/25/2022, 8:25 AM

## 2022-03-25 NOTE — Progress Notes (Signed)
  Progress Note   Patient: Kristen Robles [id number] DOB: October 26, 1990 DOA: 03/20/2022     4 DOS: the patient was seen and examined on 03/25/2022   Brief hospital course: 32 y.o. female with medical history significant of IBS, abnormal uterine bleeding presented to ED with right leg pain.  She drove in the car for about 3 to 4 hours on Friday and noticed tightness in the leg after driving.  Pain mostly located over anterior/medial thigh and unable to bear weight.  Patient denies any unintentional weight loss or IV drug use.  Assessment and Plan: * Septic arthritis of hip ([place]) -MRI findings worrisome for septic arthritis of R hip with possible communicating abscess  -Orthopedic Surgery consulted and pt is now s/p irrigation and debridement of R hip 2/20 -ID following. Cultures thus far neg. Discussed with Dr. Juleen China. Recommendation to continue current IV abx regimen through the weekend, consider transitioning to PO abx at time of d/c  Leukocytosis -Likely secondary to presenting septic arthritis -improving  Normocytic anemia, not POA -Hemodynamically stable -Recheck cbc in AM  GERD -Will start protonix -Order PRN tums     Subjective: States reflux symptoms improved  Physical Exam: Vitals:   03/24/22 0741 03/24/22 2036 03/25/22 0243 03/25/22 0837  BP: (!) 109/59 126/76 107/66 113/74  Pulse: 86 (!) 106 90 98  Resp: 17 14 18   $ Temp: 98.4 F (36.9 C) 99.4 F (37.4 C) 98.7 F (37.1 C) 99.4 F (37.4 C)  TempSrc: Oral Oral Oral   SpO2: 98% 99% 93% 96%  Weight:      Height:       General exam: Awake, laying in bed, in nad Respiratory system: Normal respiratory effort, no wheezing Cardiovascular system: regular rate, s1, s2 Gastrointestinal system: Soft, nondistended, positive BS Central nervous system: CN2-12 grossly intact, strength intact Extremities: Perfused, no clubbing Skin: Normal skin turgor, no notable skin lesions seen Psychiatry: Mood normal // no visual  hallucinations   Data Reviewed:  Labs reviewed: Na 134, K 3.7, Cr 0.69, WBC 11.3, Hgb 10.8   Family Communication: Pt in room, family at bedside  Disposition: Status is: Inpatient Remains inpatient appropriate because: Severity of illness  Planned Discharge Destination: Home     Author: Marylu Lund, MD 03/25/2022 4:38 PM  For on call review [email or web address].

## 2022-03-26 LAB — GLUCOSE, CAPILLARY
Glucose-Capillary: 104 mg/dL — ABNORMAL HIGH (ref 70–99)
Glucose-Capillary: 98 mg/dL (ref 70–99)

## 2022-03-26 LAB — CULTURE, BLOOD (ROUTINE X 2)
Culture: NO GROWTH
Special Requests: ADEQUATE

## 2022-03-26 LAB — ANAEROBIC CULTURE W GRAM STAIN

## 2022-03-26 MED ORDER — POLYETHYLENE GLYCOL 3350 17 G PO PACK: 17.0000 g | PACK | Freq: Every day | ORAL | Status: DC | PRN

## 2022-03-26 MED ORDER — GABAPENTIN 100 MG PO CAPS
100.0000 mg | ORAL_CAPSULE | Freq: Two times a day (BID) | ORAL | Status: DC
  Administered 2022-03-26 – 2022-03-28 (×4): 100 mg via ORAL
  Filled 2022-03-26 (×4): qty 1

## 2022-03-26 NOTE — Progress Notes (Signed)
Mobility Specialist - Progress Note   03/26/22 1447  Mobility  Activity Ambulated with assistance in room  Level of Assistance Standby assist, set-up cues, supervision of patient - no hands on  Assistive Device Front wheel walker  Distance Ambulated (ft) 50 ft  Activity Response Tolerated well  Mobility Referral Yes  $Mobility charge 1 Mobility   Pt received EOB and agreeable to mobility. No complaints throughout. Pt returned to EOB with all needs met.  Franki Monte  Mobility Specialist Please contact via Solicitor or Rehab office at [phone]

## 2022-03-26 NOTE — Progress Notes (Signed)
  Progress Note   Patient: Kristen Robles [id number] DOB: 1990-04-20 DOA: 03/20/2022     5 DOS: the patient was seen and examined on 03/26/2022   Brief hospital course: 32 y.o. female with medical history significant of IBS, abnormal uterine bleeding presented to ED with right leg pain.  She drove in the car for about 3 to 4 hours on Friday and noticed tightness in the leg after driving.  Pain mostly located over anterior/medial thigh and unable to bear weight.  Patient denies any unintentional weight loss or IV drug use.  Assessment and Plan: * Septic arthritis of hip ([place]) -MRI findings worrisome for septic arthritis of R hip with possible communicating abscess  -Orthopedic Surgery consulted and pt is now s/p irrigation and debridement of R hip 2/20 -ID following. Cultures thus far neg. Discussed with Dr. Juleen China. Recommendation to continue current IV abx regimen through the weekend, consider transitioning to PO abx at time of d/c -Today, complaining of more pain over post-op site. Have reached out to Orthopedic Surgery who will re-eval  Leukocytosis -Likely secondary to presenting septic arthritis -improving  Normocytic anemia, not POA -Hemodynamically stable -Recheck cbc in AM  GERD -Will start protonix -Cont PRN tums     Subjective: Complaining of increased R hip pain over post-op site  Physical Exam: Vitals:   03/25/22 2203 03/26/22 0523 03/26/22 0539 03/26/22 0730  BP: 110/61 (!) 111/49 (!) 140/55 107/66  Pulse: (!) 103 80 77 85  Resp: 17 17 17 16  $ Temp: 98 F (36.7 C) 98.6 F (37 C) 98.2 F (36.8 C) 98 F (36.7 C)  TempSrc:      SpO2: 96% 96% 92% 96%  Weight:      Height:       General exam: Conversant, in no acute distress Respiratory system: normal chest rise, clear, no audible wheezing Cardiovascular system: regular rhythm, s1-s2 Gastrointestinal system: Nondistended, nontender, pos BS Central nervous system: No seizures, no tremors Extremities: No  cyanosis, no joint deformities Skin: No rashes, no pallor Psychiatry: Affect normal // no auditory hallucinations   Data Reviewed:  There are no new results to review at this time.  Family Communication: Pt in room, family at bedside  Disposition: Status is: Inpatient Remains inpatient appropriate because: Severity of illness  Planned Discharge Destination: Home    Author: Marylu Lund, MD 03/26/2022 3:41 PM  For on call review [email or web address].

## 2022-03-27 LAB — COMPREHENSIVE METABOLIC PANEL
ALT: 42 U/L (ref 0–44)
AST: 33 U/L (ref 15–41)
Albumin: 2.5 g/dL — ABNORMAL LOW (ref 3.5–5.0)
Alkaline Phosphatase: 43 U/L (ref 38–126)
Anion gap: 11 (ref 5–15)
BUN: 6 mg/dL (ref 6–20)
CO2: 26 mmol/L (ref 22–32)
Calcium: 8.8 mg/dL — ABNORMAL LOW (ref 8.9–10.3)
Chloride: 95 mmol/L — ABNORMAL LOW (ref 98–111)
Creatinine, Ser: 0.67 mg/dL (ref 0.44–1.00)
GFR, Estimated: >60 mL/min (ref >60)
Glucose, Bld: 127 mg/dL — ABNORMAL HIGH (ref 70–99)
Potassium: 3.7 mmol/L (ref 3.5–5.1)
Sodium: 132 mmol/L — ABNORMAL LOW (ref 135–145)
Total Bilirubin: 0.3 mg/dL (ref 0.3–1.2)
Total Protein: 6 g/dL — ABNORMAL LOW (ref 6.5–8.1)

## 2022-03-27 LAB — CBC
HCT: 34.4 % — ABNORMAL LOW (ref 36.0–46.0)
Hemoglobin: 11.8 g/dL — ABNORMAL LOW (ref 12.0–15.0)
MCH: 33.1 pg (ref 26.0–34.0)
MCHC: 34.3 g/dL (ref 30.0–36.0)
MCV: 96.6 fL (ref 80.0–100.0)
Platelets: 212 10*3/uL (ref 150–400)
RBC: 3.56 MIL/uL — ABNORMAL LOW (ref 3.87–5.11)
RDW: 12.7 % (ref 11.5–15.5)
WBC: 11.3 10*3/uL — ABNORMAL HIGH (ref 4.0–10.5)
nRBC: 0 % (ref 0.0–0.2)

## 2022-03-27 LAB — AEROBIC/ANAEROBIC CULTURE W GRAM STAIN (SURGICAL/DEEP WOUND): Culture: NO GROWTH

## 2022-03-27 MED ORDER — METHOCARBAMOL 500 MG PO TABS: 500.0000 mg | ORAL_TABLET | Freq: Three times a day (TID) | ORAL | Status: DC

## 2022-03-27 MED ORDER — METHOCARBAMOL 1000 MG/10ML IJ SOLN: 500.0000 mg | Freq: Three times a day (TID) | INTRAVENOUS | Status: DC

## 2022-03-27 MED ORDER — METHOCARBAMOL 500 MG PO TABS
1000.0000 mg | ORAL_TABLET | Freq: Three times a day (TID) | ORAL | Status: DC
  Administered 2022-03-27 – 2022-03-28 (×2): 1000 mg via ORAL
  Filled 2022-03-27 (×2): qty 2

## 2022-03-27 MED ORDER — METHOCARBAMOL 1000 MG/10ML IJ SOLN
500.0000 mg | Freq: Three times a day (TID) | INTRAVENOUS | Status: DC
  Filled 2022-03-27: qty 5

## 2022-03-27 MED ORDER — HYDROCODONE-ACETAMINOPHEN 5-325 MG PO TABS
2.0000 | ORAL_TABLET | ORAL | Status: DC | PRN
  Administered 2022-03-27 – 2022-03-28 (×5): 2 via ORAL
  Filled 2022-03-27 (×5): qty 2

## 2022-03-27 MED ORDER — KETOROLAC TROMETHAMINE 15 MG/ML IJ SOLN
15.0000 mg | Freq: Three times a day (TID) | INTRAMUSCULAR | Status: DC
  Administered 2022-03-27 – 2022-03-28 (×2): 15 mg via INTRAVENOUS
  Filled 2022-03-27 (×2): qty 1

## 2022-03-27 NOTE — Progress Notes (Signed)
  Progress Note   Patient: Kristen Robles [id number] DOB: [birth year] DOA: 03/20/2022     6 DOS: the patient was seen and examined on 03/27/2022   Brief hospital course: 32 y.o. female with medical history significant of IBS, abnormal uterine bleeding presented to ED with right leg pain.  She drove in the car for about 3 to 4 hours on Friday and noticed tightness in the leg after driving.  Pain mostly located over anterior/medial thigh and unable to bear weight.  Patient denies any unintentional weight loss or IV drug use.  Assessment and Plan: * Septic arthritis of hip ([place]) -MRI findings worrisome for septic arthritis of R hip with possible communicating abscess  -Orthopedic Surgery consulted and pt is now s/p irrigation and debridement of R hip 2/20 -ID following. Cultures thus far neg. Discussed with Dr. Juleen China. Recommendation to continue current IV abx regimen through the weekend, consider transitioning to PO abx at time of d/c -complains of continued incision site pains. Discussed with Orthopedic Surgery who re-evaluated today.  -continue analgesia  Leukocytosis -Likely secondary to presenting septic arthritis -stable  Normocytic anemia, not POA -Hemodynamically stable -Recheck cbc in AM  GERD -Will start protonix -Cont PRN tums     Subjective: Still with R hip pains around post-op site  Physical Exam: Vitals:   03/26/22 1617 03/26/22 2243 03/27/22 0626 03/27/22 0915  BP: 120/84 95/70 111/69 105/65  Pulse: (!) 102 99 94 95  Resp: 18 17 17 16  $ Temp: 99.5 F (37.5 C) 98.7 F (37.1 C) 98.7 F (37.1 C) 98.8 F (37.1 C)  TempSrc: Oral Oral  Oral  SpO2: 96% 99% 98% 98%  Weight:      Height:       General exam: Awake, laying in bed, in nad Respiratory system: Normal respiratory effort, no wheezing Cardiovascular system: regular rate, s1, s2 Gastrointestinal system: Soft, nondistended, positive BS Central nervous system: CN2-12 grossly intact, strength  intact Extremities: Perfused, no clubbing Skin: Normal skin turgor, no notable skin lesions seen Psychiatry: Mood normal // no visual hallucinations   Data Reviewed:  Labs reviewed: Na 132, K 3.7, Cr 0.67, WBC 11.3, Hgb 11.8  Family Communication: Pt in room, family at bedside  Disposition: Status is: Inpatient Remains inpatient appropriate because: Severity of illness  Planned Discharge Destination: Home    Author: Marylu Lund, MD 03/27/2022 4:46 PM  For on call review [email or web address].

## 2022-03-27 NOTE — Plan of Care (Signed)

## 2022-03-27 NOTE — Progress Notes (Signed)
Orthopaedic Trauma Service Progress Note  Patient ID: Kristen Robles MRN: [id number] DOB/AGE: 1990-09-07 32 y.o.  Subjective:  Asked to see by medicine team due to increased R leg pain Pt doing well today Mild pain R thigh  More diffuse in nature No specific complaints   Remains afebrile   Cultures:  NGTD   ROS As above Objective:   VITALS:   Vitals:   03/26/22 1617 03/26/22 2243 03/27/22 0626 03/27/22 0915  BP: 120/84 95/70 111/69 105/65  Pulse: (!) 102 99 94 95  Resp: '18 17 17 16  '$ Temp: 99.5 F (37.5 C) 98.7 F (37.1 C) 98.7 F (37.1 C) 98.8 F (37.1 C)  TempSrc: Oral Oral  Oral  SpO2: 96% 99% 98% 98%  Weight:      Height:        Estimated body mass index is 30.18 kg/m as calculated from the following:   Height as of this encounter: '5\' 2"'$  (1.575 m).   Weight as of this encounter: 74.8 kg.   Intake/Output      02/24 0701 02/25 0700 02/25 0701 02/26 0700   IV Piggyback  2100   Total Intake(mL/kg)  2100 (28.1)   Net  +2100        Stool Occurrence  1 x     LABS  Results for orders placed or performed during the hospital encounter of 03/20/22 (from the past 24 hour(s))  CBC     Status: Abnormal   Collection Time: 03/27/22  4:46 AM  Result Value Ref Range   WBC 11.3 (H) 4.0 - 10.5 K/uL   RBC 3.56 (L) 3.87 - 5.11 MIL/uL   Hemoglobin 11.8 (L) 12.0 - 15.0 g/dL   HCT 34.4 (L) 36.0 - 46.0 %   MCV 96.6 80.0 - 100.0 fL   MCH 33.1 26.0 - 34.0 pg   MCHC 34.3 30.0 - 36.0 g/dL   RDW 12.7 11.5 - 15.5 %   Platelets 212 150 - 400 K/uL   nRBC 0.0 0.0 - 0.2 %  Comprehensive metabolic panel     Status: Abnormal   Collection Time: 03/27/22  4:46 AM  Result Value Ref Range   Sodium 132 (L) 135 - 145 mmol/L   Potassium 3.7 3.5 - 5.1 mmol/L   Chloride 95 (L) 98 - 111 mmol/L   CO2 26 22 - 32 mmol/L   Glucose, Bld 127 (H) 70 - 99 mg/dL   BUN 6 6 - 20 mg/dL   Creatinine, Ser 0.67 0.44 -  1.00 mg/dL   Calcium 8.8 (L) 8.9 - 10.3 mg/dL   Total Protein 6.0 (L) 6.5 - 8.1 g/dL   Albumin 2.5 (L) 3.5 - 5.0 g/dL   AST 33 15 - 41 U/L   ALT 42 0 - 44 U/L   Alkaline Phosphatase 43 38 - 126 U/L   Total Bilirubin 0.3 0.3 - 1.2 mg/dL   GFR, Estimated >60 >60 mL/min   Anion gap 11 5 - 15     PHYSICAL EXAM:   Gen: resting comfortably in bed, NAD  Ext:       Right Lower extremity   Dressing removed  Incision clean, dry and intact. Steri strips intact  No erythema or signs of infection  Thigh is soft  No pain out of proportion with axial load or  log roll of hip    Distal motor and sensory functions intact  Ext warm   +  DP pulse   No DCT    Assessment/Plan: 5 Days Post-Op   Principal Problem:   Septic arthritis of hip ([place])   Anti-infectives (From admission, onward)    Start     Dose/Rate Route Frequency Ordered Stop   03/23/22 1400  cefTRIAXone (ROCEPHIN) 2 g in sodium chloride 0.9 % 100 mL IVPB        2 g 200 mL/hr over 30 Minutes Intravenous Daily 03/23/22 0909     03/22/22 1815  ceFAZolin (ANCEF) IVPB 1 g/50 mL premix  Status:  Discontinued        1 g 100 mL/hr over 30 Minutes Intravenous Every 6 hours 03/22/22 1718 03/22/22 1720   03/22/22 1542  vancomycin (VANCOCIN) powder  Status:  Discontinued          As needed 03/22/22 1542 03/22/22 1618   03/22/22 1406  ceFAZolin (ANCEF) 2-4 GM/100ML-% IVPB       Note to Pharmacy: Tamsen Snider M: cabinet override      03/22/22 1406 03/22/22 1533   03/22/22 0900  ceFAZolin (ANCEF) IVPB 2g/100 mL premix        2 g 200 mL/hr over 30 Minutes Intravenous On call to O.R. 03/21/22 1831 03/22/22 1525   03/21/22 1000  vancomycin (VANCOCIN) IVPB 1000 mg/200 mL premix        1,000 mg 200 mL/hr over 60 Minutes Intravenous Every 12 hours 03/21/22 0443     03/21/22 0600  piperacillin-tazobactam (ZOSYN) IVPB 3.375 g  Status:  Discontinued        3.375 g 12.5 mL/hr over 240 Minutes Intravenous Every 8 hours 03/21/22 0443 03/23/22  0909   03/20/22 2300  piperacillin-tazobactam (ZOSYN) IVPB 3.375 g        3.375 g 100 mL/hr over 30 Minutes Intravenous  Once 03/20/22 2256 03/20/22 2338   03/20/22 2300  vancomycin (VANCOREADY) IVPB 1500 mg/300 mL  Status:  Discontinued        1,500 mg 150 mL/hr over 120 Minutes Intravenous Every 12 hours 03/20/22 2256 03/21/22 0902     .  POD/HD#: 60  31 y/o female with septic arthritis of native R hip, unclear etiology   Cultures from surgery still show NGTD, will continue to follow  Advance diet Up with therapy Incentive Spirometry Elevate and Apply ice   Weightbearing: WBAT RLE, no ROM restrictions Insicional and dressing care: Dressing changed today, ok to leave until follow up with Dr. Percell Miller Orthopedic device(s): None Showering: Keep dressing dry VTE prophylaxis: Lovenox '40mg'$  qd  while in patient , SCDs, ambulation Pain control: Tylenol, Norco, Tramadol, Dilaudid PRN. Wean iv dilaudid. Will schedule robaxin and 48 hours of toradol. Monitor renal function with concomitant vanco regimen Follow - up plan: 1 week post op Contact information:  Edmonia Lynch MD, Aggie Moats PA-C   Dispo:  per primary. Ok for d/c from orthopedic standpoint at any time.     Jari Pigg, PA-C [phone] (C) 03/27/2022, 5:33 PM  [facility] [place] [address] [phone] Jenetta [phone] (F)    After 5pm and on the weekends please log on to Amion, go to orthopaedics and the look under the [facility] Call for the provider(s) on call. You can also call our office at [phone] and then follow the prompts to be connected to the call team.  Patient ID: Kristen Robles,  female   DOB: [birth year], 32 y.o.   MRN: [id number]

## 2022-03-28 ENCOUNTER — Other Ambulatory Visit (HOSPITAL_COMMUNITY): Payer: Self-pay

## 2022-03-28 LAB — CBC
HCT: 33.4 % — ABNORMAL LOW (ref 36.0–46.0)
Hemoglobin: 11.3 g/dL — ABNORMAL LOW (ref 12.0–15.0)
MCH: 32.8 pg (ref 26.0–34.0)
MCHC: 33.8 g/dL (ref 30.0–36.0)
MCV: 97.1 fL (ref 80.0–100.0)
Platelets: 238 10*3/uL (ref 150–400)
RBC: 3.44 MIL/uL — ABNORMAL LOW (ref 3.87–5.11)
RDW: 12.6 % (ref 11.5–15.5)
WBC: 11.1 10*3/uL — ABNORMAL HIGH (ref 4.0–10.5)
nRBC: 0 % (ref 0.0–0.2)

## 2022-03-28 LAB — BASIC METABOLIC PANEL
Anion gap: 8 (ref 5–15)
BUN: <5 mg/dL — ABNORMAL LOW (ref 6–20)
CO2: 28 mmol/L (ref 22–32)
Calcium: 9 mg/dL (ref 8.9–10.3)
Chloride: 96 mmol/L — ABNORMAL LOW (ref 98–111)
Creatinine, Ser: 0.69 mg/dL (ref 0.44–1.00)
GFR, Estimated: >60 mL/min (ref >60)
Glucose, Bld: 106 mg/dL — ABNORMAL HIGH (ref 70–99)
Potassium: 3.9 mmol/L (ref 3.5–5.1)
Sodium: 132 mmol/L — ABNORMAL LOW (ref 135–145)

## 2022-03-28 MED ORDER — CIPROFLOXACIN HCL 750 MG PO TABS
750.0000 mg | ORAL_TABLET | Freq: Two times a day (BID) | ORAL | Status: DC
  Administered 2022-03-28: 750 mg via ORAL
  Filled 2022-03-28 (×2): qty 1

## 2022-03-28 MED ORDER — METHOCARBAMOL 500 MG PO TABS
1000.0000 mg | ORAL_TABLET | Freq: Three times a day (TID) | ORAL | 0 refills | Status: AC
  Filled 2022-03-28: qty 120, 20d supply, fill #0

## 2022-03-28 MED ORDER — HYDROCODONE-ACETAMINOPHEN 5-325 MG PO TABS
1.0000 | ORAL_TABLET | ORAL | 0 refills | Status: AC | PRN
  Filled 2022-03-28: qty 20, 4d supply, fill #0

## 2022-03-28 MED ORDER — ONDANSETRON HCL 4 MG PO TABS
4.0000 mg | ORAL_TABLET | Freq: Four times a day (QID) | ORAL | 0 refills | Status: AC | PRN
  Filled 2022-03-28: qty 20, 5d supply, fill #0

## 2022-03-28 MED ORDER — DOCUSATE SODIUM 100 MG PO CAPS
100.0000 mg | ORAL_CAPSULE | Freq: Two times a day (BID) | ORAL | 0 refills | Status: AC
  Filled 2022-03-28: qty 30, 15d supply, fill #0

## 2022-03-28 MED ORDER — POLYETHYLENE GLYCOL 3350 17 GM/SCOOP PO POWD
17.0000 g | Freq: Every day | ORAL | 0 refills | Status: AC | PRN
  Filled 2022-03-28: qty 238, 14d supply, fill #0

## 2022-03-28 MED ORDER — CIPROFLOXACIN HCL 750 MG PO TABS
750.0000 mg | ORAL_TABLET | Freq: Two times a day (BID) | ORAL | 0 refills | Status: AC
  Filled 2022-03-28: qty 44, 22d supply, fill #0

## 2022-03-28 MED ORDER — DOXYCYCLINE HYCLATE 100 MG PO TABS
100.0000 mg | ORAL_TABLET | Freq: Two times a day (BID) | ORAL | 0 refills | Status: AC
  Filled 2022-03-28: qty 44, 22d supply, fill #0

## 2022-03-28 MED ORDER — PANTOPRAZOLE SODIUM 40 MG PO TBEC
40.0000 mg | DELAYED_RELEASE_TABLET | Freq: Every day | ORAL | 0 refills | Status: AC
  Filled 2022-03-28: qty 30, 30d supply, fill #0

## 2022-03-28 MED ORDER — DOXYCYCLINE HYCLATE 100 MG PO TABS
100.0000 mg | ORAL_TABLET | Freq: Two times a day (BID) | ORAL | Status: DC
  Administered 2022-03-28: 100 mg via ORAL
  Filled 2022-03-28: qty 1

## 2022-03-28 NOTE — Progress Notes (Signed)
[facility]  Date of Admission:  03/20/2022           Reason for visit: Follow up on right hip septic arthritis  Current antibiotics: Vancomycin Ceftriaxone    ASSESSMENT:    32 y.o. female admitted with:  Native right hip septic arthritis: Patient admitted with worsening right hip pain and found to have septic arthritis based on imaging and joint aspiration.  Status post I&D with orthopedics 03/22/22 and cultures finalized as no growth.   RECOMMENDATIONS:    Will switch to ciprofloxacin '750mg'$  PO BID and doxycycline '100mg'$  PO BID x 3 more weeks to complete 4 total weeks of therapy for culture negative septic arthritis End date for antibiotics = 04/19/22 Follow up with me at [facility] on 04/18/22 at 2:30pm Will sign off, please call as needed   Principal Problem:   Septic arthritis of hip (Kristen Robles)    MEDICATIONS:    Scheduled Meds:  ciprofloxacin  750 mg Oral BID   docusate sodium  100 mg Oral BID   doxycycline  100 mg Oral Q12H   enoxaparin (LOVENOX) injection  40 mg Subcutaneous Q24H   gabapentin  100 mg Oral BID   ketorolac  15 mg Intravenous Q8H   methocarbamol  1,000 mg Oral TID   pantoprazole  40 mg Oral Daily   Continuous Infusions:  methocarbamol (ROBAXIN) IV     PRN Meds:.calcium carbonate, diphenhydrAMINE, HYDROcodone-acetaminophen, HYDROmorphone (DILAUDID) injection, metoCLOPramide **OR** metoCLOPramide (REGLAN) injection, ondansetron **OR** ondansetron (ZOFRAN) IV, polyethylene glycol, traMADol  SUBJECTIVE:   24 hour events:  All cultures finalized as no growth Seen by orthopedic surgery yesterday due to right leg pain WBC stable Afebrile No other acute events noted.  She lost IV access this morning and is looking forward to going home.  She reports having follow up at home near [place], [place] after discharge. Her mom is with her this morning.   Review of Systems  All other systems reviewed and are negative.     OBJECTIVE:    Blood pressure 103/66, pulse 85, temperature 97.8 F (36.6 C), resp. rate 18, height '5\' 2"'$  (1.575 m), weight 74.8 kg, SpO2 97 %. Body mass index is 30.18 kg/m.  Physical Exam Constitutional:      General: She is not in acute distress.    Appearance: Normal appearance.  HENT:     Head: Normocephalic and atraumatic.  Eyes:     Extraocular Movements: Extraocular movements intact.     Conjunctiva/sclera: Conjunctivae normal.  Pulmonary:     Effort: Pulmonary effort is normal. No respiratory distress.  Skin:    General: Skin is warm and dry.  Neurological:     General: No focal deficit present.     Mental Status: She is alert and oriented to person, place, and time.  Psychiatric:        Mood and Affect: Mood normal.        Behavior: Behavior normal.      Lab Results: Lab Results  Component Value Date   WBC 11.1 (H) 03/28/2022   HGB 11.3 (L) 03/28/2022   HCT 33.4 (L) 03/28/2022   MCV 97.1 03/28/2022   PLT 238 03/28/2022    Lab Results  Component Value Date   NA 132 (L) 03/28/2022   K 3.9 03/28/2022   CO2 28 03/28/2022   GLUCOSE 106 (H) 03/28/2022   BUN <5 (L) 03/28/2022   CREATININE 0.69 03/28/2022   CALCIUM 9.0 03/28/2022   GFRNONAA >  60 03/28/2022   GFRAA >90 09/03/2011    Lab Results  Component Value Date   ALT 42 03/27/2022   AST 33 03/27/2022   ALKPHOS 43 03/27/2022   BILITOT 0.3 03/27/2022       Component Value Date/Time   CRP 6.7 (H) 03/20/2022 2007       Component Value Date/Time   ESRSEDRATE 17 03/20/2022 2007     I have reviewed the micro and lab results in [facility].  Imaging: No results found.   Imaging independently reviewed in Epic.    [facility] [phone] pager 03/28/2022, 12:09 PM   I have personally spent 35 minutes involved in face-to-face and non-face-to-face activities for this patient on the day of the visit. Professional time spent includes the  following activities: Preparing to see the patient (review of tests), Obtaining and/or reviewing separately obtained history (admission/discharge record), Performing a medically appropriate examination and/or evaluation , Ordering medications/tests/procedures, referring and communicating with other health care professionals, Documenting clinical information in the EMR, Independently interpreting results (not separately reported), Communicating results to the patient/family/caregiver, Counseling and educating the patient/family/caregiver and Care coordination (not separately reported).

## 2022-03-28 NOTE — Progress Notes (Signed)
Physical Therapy Treatment Patient Details Name: Kristen Robles MRN: [id number] DOB: 04-22-1990 Today's Date: 03/28/2022   History of Present Illness 32 yo female presents to ED on 2/18 with RLE pain. S/p R hip I&D 2/20 for septic arthritis. PMH Includes IBS.    PT Comments    Pt continues to have significant R hip and thigh pain with mobility, but overall tolerance for activity and hip pain improving. Pt ambulatory in hallway with very increased time and use of RW. PT encouraged pt to continue OOB mobility and AROM exercises for RLE, pt expresses understanding.    Recommendations for follow up therapy are one component of a multi-disciplinary discharge planning process, led by the attending physician.  Recommendations may be updated based on patient status, additional functional criteria and insurance authorization.  Follow Up Recommendations  No PT follow up (vs OPPT pending pt progress)     Assistance Recommended at Discharge Set up Supervision/Assistance  Patient can return home with the following A little help with walking and/or transfers;A little help with bathing/dressing/bathroom   Equipment Recommendations  None recommended by PT (can borrow RW)    Recommendations for Other Services       Precautions / Restrictions Precautions Precautions: Fall Restrictions Weight Bearing Restrictions: No     Mobility  Bed Mobility Overal bed mobility: Needs Assistance Bed Mobility: Supine to Sit, Sit to Supine     Supine to sit: Supervision     General bed mobility comments: increased time, pt using UEs to lift RLE    Transfers Overall transfer level: Needs assistance Equipment used: Rolling walker (2 wheels) Transfers: Sit to/from Stand Sit to Stand: Supervision           General transfer comment: slow to rise, use of RW    Ambulation/Gait Ambulation/Gait assistance: Supervision Gait Distance (Feet): 270 Feet Assistive device: Rolling walker (2  wheels) Gait Pattern/deviations: Step-through pattern, Decreased stride length, Antalgic Gait velocity: very decr     General Gait Details: cues for upright posture, standing rest break x1 for 2 min given R hip pain   Stairs             Wheelchair Mobility    Modified Rankin (Stroke Patients Only)       Balance Overall balance assessment: Needs assistance Sitting-balance support: No upper extremity supported, Feet supported Sitting balance-Leahy Scale: Good     Standing balance support: Bilateral upper extremity supported, During functional activity Standing balance-Leahy Scale: Fair Standing balance comment: use of RW for offweighting RLE                            Cognition Arousal/Alertness: Awake/alert Behavior During Therapy: WFL for tasks assessed/performed Overall Cognitive Status: Within Functional Limits for tasks assessed                                          Exercises      General Comments        Pertinent Vitals/Pain Pain Assessment Pain Assessment: Faces Faces Pain Scale: Hurts little more Pain Location: R hip Pain Descriptors / Indicators: Sore Pain Intervention(s): Limited activity within patient's tolerance, Monitored during session, Repositioned, RN gave pain meds during session    Home Living  Prior Function            PT Goals (current goals can now be found in the care plan section) Acute Rehab PT Goals PT Goal Formulation: With patient Time For Goal Achievement: 04/06/22 Potential to Achieve Goals: Good Progress towards PT goals: Progressing toward goals    Frequency    Min 3X/week      PT Plan Current plan remains appropriate    Co-evaluation              AM-PAC PT "6 Clicks" Mobility   Outcome Measure  Help needed turning from your back to your side while in a flat bed without using bedrails?: None Help needed moving from lying on your  back to sitting on the side of a flat bed without using bedrails?: None Help needed moving to and from a bed to a chair (including a wheelchair)?: None Help needed standing up from a chair using your arms (e.g., wheelchair or bedside chair)?: A Little Help needed to walk in hospital room?: A Little Help needed climbing 3-5 steps with a railing? : A Little 6 Click Score: 21    End of Session   Activity Tolerance: Patient tolerated treatment well Patient left: in bed;with call bell/phone within reach;with family/visitor present Nurse Communication: Mobility status PT Visit Diagnosis: Other abnormalities of gait and mobility (R26.89);Muscle weakness (generalized) (M62.81)     Time: [id number] PT Time Calculation (min) (ACUTE ONLY): 31 min  Charges:  $Gait Training: 8-22 mins $Therapeutic Activity: 8-22 mins                     Stacie Glaze, PT DPT Acute Rehabilitation Services Pager [phone]  Office [phone]    [place] 03/28/2022, 10:03 AM

## 2022-03-28 NOTE — Discharge Summary (Signed)
Physician Discharge Summary   Patient: Kristen Robles MRN: [id number] DOB: Jul 29, 1990  Admit date:     03/20/2022  Discharge date: 03/28/22  Discharge Physician: Marylu Lund   PCP: No Pcp, Per Patient (Inactive)   Recommendations at discharge:    Follow up with PCP as scheduled Follow up with Orthopedic Surgery as scheduled  Discharge Diagnoses: Principal Problem:   Septic arthritis of hip ([place])  Resolved Problems:   * No resolved hospital problems. *  Hospital Course: 32 y.o. female with medical history significant of IBS, abnormal uterine bleeding presented to ED with right leg pain.  She drove in the car for about 3 to 4 hours on Friday and noticed tightness in the leg after driving.  Pain mostly located over anterior/medial thigh and unable to bear weight.  Patient denies any unintentional weight loss or IV drug use.  Assessment and Plan: * Septic arthritis of hip ([place]) -MRI findings worrisome for septic arthritis of R hip with possible communicating abscess  -Orthopedic Surgery consulted and pt is now s/p irrigation and debridement of R hip 2/20 -ID following. Cultures thus far neg. Pt had been continued on empiric vanc and rocephin -Discussed with Dr. Juleen China. Recommendation to transition to doxycycline and cipro to complete 22 more days after discharge -complained of incision site pains. Discussed with Orthopedic Surgery who re-evaluated. Per Ortho, site looked OK -continue analgesia   Leukocytosis -Likely secondary to presenting septic arthritis -stable   Normocytic anemia, not POA -Hemodynamically stable   GERD -started protonix       Consultants: Orthopedic Surgery, ID Procedures performed: I/D of R hip  Disposition: Home Diet recommendation:  Regular diet DISCHARGE MEDICATION: Allergies as of 03/28/2022       Reactions   Oxycodone-acetaminophen Itching, Rash   Tomato Anaphylaxis, Hives        Medication List     TAKE these medications     ciprofloxacin 750 MG tablet Commonly known as: CIPRO Take 1 tablet (750 mg total) by mouth 2 (two) times daily for 22 days.   docusate sodium 100 MG capsule Commonly known as: COLACE Take 1 capsule (100 mg total) by mouth 2 (two) times daily.   doxycycline 100 MG tablet Commonly known as: VIBRA-TABS Take 1 tablet (100 mg total) by mouth 2 (two) times daily for 22 days.   HYDROcodone-acetaminophen 5-325 MG tablet Commonly known as: NORCO/VICODIN Take 1 tablet by mouth every 4 (four) hours as needed for moderate pain or severe pain.   Methocarbamol 1000 MG Tabs Take 1,000 mg by mouth 3 (three) times daily.   ondansetron 4 MG tablet Commonly known as: ZOFRAN Take 1 tablet (4 mg total) by mouth every 6 (six) hours as needed for nausea.   pantoprazole 40 MG tablet Commonly known as: PROTONIX Take 1 tablet (40 mg total) by mouth daily. Start taking on: March 29, 2022   polyethylene glycol 17 g packet Commonly known as: MIRALAX / GLYCOLAX Take 17 g by mouth daily as needed for moderate constipation.        Follow-up Information     [name], MD. Schedule an appointment as soon as possible for a visit in 1 week(s).   Specialty: Orthopedic Surgery Contact information: [address] [place] [address] [phone]         Honaker, Jason K, [place] Follow up.   Specialty: Family Medicine Why: You are scheduled for a hospital follow up on April 04, 2022 at 1:30 pm. Contact information:  [facility] [address] [place] [address] [phone]                Discharge Exam: Danley Danker Weights   03/20/22 1628 03/22/22 1359  Weight: 74.8 kg 74.8 kg   General exam: Awake, laying in bed, in nad Respiratory system: Normal respiratory effort, no wheezing Cardiovascular system: regular rate, s1, s2 Gastrointestinal system: Soft, nondistended, positive BS Central nervous system: CN2-12 grossly intact, strength intact Extremities:  Perfused, no clubbing Skin: Normal skin turgor, no notable skin lesions seen Psychiatry: Mood normal // no visual hallucinations   Condition at discharge: fair  The results of significant diagnostics from this hospitalization (including imaging, microbiology, ancillary and laboratory) are listed below for reference.   Imaging Studies: DG FLUORO GUIDED NEEDLE PLC ASPIRATION/INJECTION LOC  Result Date: 03/21/2022 CLINICAL DATA:  32 year old female with acute onset right hip pain. Request made for right hip effusion aspiration. EXAM: RIGHT HIP ASPIRATION PROCEDURE: After a thorough discussion of risks and benefits of the procedure, written and oral informed consent was obtained. The consent discussion included the risk of bleeding, infection and injury to nerves and adjacent blood vessels. Extra-articular injection was also a possible risk discussed. Preliminary localization was performed over the right hip. The area was marked over the junction of the right femoral head and neck. After prep and drape in the usual sterile fashion, the skin and deeper subcutaneous tissues were anesthetized with 1% Lidocaine without Epinephrine. Under fluoroscopic guidance, an 18 gauge 3.5 inch spinal needle was advanced into the joint at the lateral margin of the junction of the femoral head and neck. Intra-articular aspiration was performed which flowed freely and subsequently 16 mL of thick brown fluid was removed. An end point was felt and procedure was discontinued, the needle removed, and a sterile dressing applied. The patient tolerated the procedure well and there were no complications. FLUOROSCOPY: Radiation Exposure Index (as provided by the fluoroscopic device): 5.7 mGy Kerma IMPRESSION: Successful right hip fluoroscopically guided aspiration yielding 16 mL of thick, brown fluid. Fluid sent for requested testing. This exam was performed by Brynda Greathouse PA-C, and was supervised and interpreted by Zetta Bills, MD.  Electronically Signed   By: Zetta Bills M.D.   On: 03/21/2022 17:16   MR HIP RIGHT W WO CONTRAST  Result Date: 03/20/2022 CLINICAL DATA:  Right hip joint effusion on CT examination performed earlier on the same date. Hip pain, septic arthritis suspected EXAM: MRI OF THE RIGHT HIP WITHOUT AND WITH CONTRAST TECHNIQUE: Multiplanar, multisequence MR imaging was performed both before and after administration of intravenous contrast. CONTRAST:  7.64m GADAVIST GADOBUTROL 1 MMOL/ML IV SOLN COMPARISON:  CT examination dated March 20, 2018 FINDINGS: Bones/right hip joint/surrounding soft tissues: Marrow signal is within normal limits. There is a large right hip joint effusion with marked surrounding edema and inflammatory changes involving the surrounding muscles predominantly involving the adductor longus and brevis muscles as well as edema and inflammatory changes of the vastus intermedius muscles. There is also mild edema of the gluteus medius and minimus muscles about its insertion. Articular cartilage and labrum Articular cartilage:  Intact Labrum:  Intact Muscles and tendons Muscles and tendons: Marked edema of the muscles about the right hip joint predominantly involving the adductor muscles as well as gluteus medius/minimus and vastus intermedius muscle. There is a peripherally enhancing fluid collection about the adductor muscles measuring at least 1.6 x 3.0 cm, this collection appears to be connected to the right hip joint.  Other findings Miscellaneous: Left hip joint is unremarkable. No acute abdominal/pelvic process IMPRESSION: 1. Large right hip joint effusion with marked surrounding edema and inflammatory changes involving the adductor muscles as well as the vastus intermedius muscle and to a lesser extent the gluteus medius and minimus muscles. Findings are concerning for septic arthritis. 2. There is a peripherally enhancing fluid collection about the adductor muscles measuring at least 1.6 x 3.0 cm,  this collection appears to be connected to the right hip joint. 3. Marrow signal is within normal limits without evidence of osteomyelitis. Right hip septic arthritis with surrounding inflammatory changes predominantly involving the adductor muscles. Immediate orthopedic consultation for further management is recommended. Above findings and recommendations were called by telephone at the time of interpretation on 03/20/2022 at 10:57 pm to provider PA Heide Guile , who verbally acknowledged these results. Electronically Signed   By: Keane Police D.O.   On: 03/20/2022 22:59   CT Abdomen Pelvis W Contrast  Result Date: 03/20/2022 CLINICAL DATA:  Right groin adenopathy. EXAM: CT ABDOMEN AND PELVIS WITH CONTRAST TECHNIQUE: Multidetector CT imaging of the abdomen and pelvis was performed using the standard protocol following bolus administration of intravenous contrast. RADIATION DOSE REDUCTION: This exam was performed according to the departmental dose-optimization program which includes automated exposure control, adjustment of the mA and/or kV according to patient size and/or use of iterative reconstruction technique. CONTRAST:  78m OMNIPAQUE IOHEXOL 350 MG/ML SOLN COMPARISON:  None Available. FINDINGS: Lower chest: The lung bases are clear of acute process. No pleural effusion or pulmonary lesions. The heart is normal in size. No pericardial effusion. The distal esophagus and aorta are unremarkable. Hepatobiliary: No focal hepatic lesions or intrahepatic biliary dilatation. The gallbladder is normal. No common bile duct dilatation. Pancreas: Normal Spleen: Normal Adrenals/Urinary Tract: Normal Stomach/Bowel: The stomach, duodenum, small bowel and colon are unremarkable. The terminal ileum and appendix are. Vascular/Lymphatic: The aorta is normal in caliber. No dissection. The branch vessels are patent. The major venous structures are patent. No mesenteric or retroperitoneal mass or adenopathy. Small scattered lymph  nodes are noted. Reproductive: The uterus and ovaries are unremarkable. Other: No pelvic mass or adenopathy. Small amount of free pelvic fluid, likely physiologic. No inguinal mass or adenopathy. No abdominal wall hernia or subcutaneous lesions. Small right inguinal lymph nodes but no overt lymphadenopathy. Musculoskeletal: There is a moderate to large right hip joint effusion. I do not see any destructive bony changes to suggest septic arthritis but recommend correlation with clinical findings such as white count, fever and sed rate. No evidence of AVN. The pubic symphysis and SI joints are intact. IMPRESSION: 1. No acute abdominal/pelvic findings, mass lesions or adenopathy. 2. Small amount of free pelvic fluid, likely physiologic. 3. Abnormal right hip joint effusion. Could not exclude septic arthritis. Recommend clinical correlation. MRI right hip without and with contrast may be helpful for further evaluation. Electronically Signed   By: PMarijo SanesM.D.   On: 03/20/2022 16:31   VAS [place]OWER EXTREMITY VENOUS (DVT) (7a-7p)  Result Date: 03/20/2022  Lower Venous DVT Study Patient Name:  ZTAKOTA RACKOW Date of Exam:   03/20/2022 Medical Rec #: [id number]          Accession #:    [id number] of Birth: [birth year]          Patient Gender: F Patient Age:   89years [facility]:      V[facility] [place]OWER EXTREMITY VENOUS (DVT)  Referring Phys: Dorise Bullion --------------------------------------------------------------------------------  Indications: Thigh pain.  Comparison Study: No prior study on file Performing Technologist: Sharion Dove RVS  Examination Guidelines: A complete evaluation includes B-mode imaging, spectral Doppler, color Doppler, and power Doppler as needed of all accessible portions of each vessel. Bilateral testing is considered an integral part of a complete examination. Limited examinations for reoccurring indications may be performed as noted. The  reflux portion of the exam is performed with the patient in reverse Trendelenburg.  +---------+---------------+---------+-----------+--------------+--------------+ RIGHT    CompressibilityPhasicitySpontaneityProperties    Thrombus Aging +---------+---------------+---------+-----------+--------------+--------------+ CFV      Full                               pulsatile flow               +---------+---------------+---------+-----------+--------------+--------------+ SFJ      Full                                                            +---------+---------------+---------+-----------+--------------+--------------+ FV Prox  Full                               pulsatile flow               +---------+---------------+---------+-----------+--------------+--------------+ FV Mid   Full                                                            +---------+---------------+---------+-----------+--------------+--------------+ FV DistalFull                                                            +---------+---------------+---------+-----------+--------------+--------------+ PFV      Full                               pulsatile flow               +---------+---------------+---------+-----------+--------------+--------------+ POP      Full                                                            +---------+---------------+---------+-----------+--------------+--------------+ PTV      Full                                                            +---------+---------------+---------+-----------+--------------+--------------+ PERO     Full                                                            +---------+---------------+---------+-----------+--------------+--------------+   +----+---------------+---------+-----------+--------------+--------------+  LEFTCompressibilityPhasicitySpontaneityProperties    Thrombus Aging  +----+---------------+---------+-----------+--------------+--------------+ CFV Full                               pulsatile flow               +----+---------------+---------+-----------+--------------+--------------+     Summary: RIGHT: - There is no evidence of deep vein thrombosis in the lower extremity.  - No cystic structure found in the popliteal fossa. - Ultrasound characteristics of enlarged lymph nodes are noted in the groin. pulsatile flow noted  LEFT: - No evidence of common femoral vein obstruction. Pulsatile flow noted.  *See table(s) above for measurements and observations. Electronically signed by Deitra Mayo MD on 03/20/2022 at 3:39:46 PM.    Final     Microbiology: Results for orders placed or performed during the hospital encounter of 03/20/22  Culture, blood (routine x 2)     Status: None   Collection Time: 03/20/22  8:38 PM   Specimen: BLOOD  Result Value Ref Range Status   Specimen Description BLOOD LEFT ANTECUBITAL  Final   Special Requests   Final    BOTTLES DRAWN AEROBIC AND ANAEROBIC Blood Culture adequate volume   Culture   Final    NO GROWTH 5 DAYS Performed at [facility], [address], [place], [place] [address]    Report Status 03/25/2022 FINAL  Final  Culture, blood (routine x 2)     Status: None   Collection Time: 03/21/22  4:59 AM   Specimen: BLOOD  Result Value Ref Range Status   Specimen Description BLOOD BLOOD LEFT ARM  Final   Special Requests   Final    BOTTLES DRAWN AEROBIC AND ANAEROBIC Blood Culture adequate volume   Culture   Final    NO GROWTH 5 DAYS Performed at [facility], [place] [address] [place], [place] [address]    Report Status 03/26/2022 FINAL  Final  Anaerobic culture w Gram Stain     Status: None   Collection Time: 03/21/22  5:01 PM   Specimen: PATH Cytology Misc. fluid; Synovial Fluid  Result Value Ref Range Status   Specimen Description SYNOVIAL  Final   Special Requests RIGHT HIP  Final   Gram  Stain   Final    ABUNDANT WBC PRESENT, PREDOMINANTLY PMN NO ORGANISMS SEEN    Culture   Final    NO ANAEROBES ISOLATED Performed at [facility], [address] [place], [place] [address]    Report Status 03/26/2022 FINAL  Final  Surgical PCR screen     Status: Abnormal   Collection Time: 03/22/22  5:26 AM   Specimen: Nasal Mucosa; Nasal Swab  Result Value Ref Range Status   MRSA, PCR NEGATIVE NEGATIVE Final   Staphylococcus aureus POSITIVE (A) NEGATIVE Final    Comment: (NOTE) The Xpert SA Assay (FDA approved for NASAL specimens in patients 24 years of age and older), is one component of a comprehensive surveillance program. It is not intended to diagnose infection nor to guide or monitor treatment. Performed at [facility], [place] [address] [place], [place] [address]   Gram stain     Status: None   Collection Time: 03/22/22  9:22 AM   Specimen: Synovium; Body Fluid  Result Value Ref Range Status   Specimen Description SYNOVIAL RIGHT HIP  Final   Special Requests NONE  Final   Gram Stain   Final  MODERATE WBC PRESENT, PREDOMINANTLY PMN NO ORGANISMS SEEN Performed at [facility] [address], [place], [place] [address]    Report Status 03/22/2022 FINAL  Final  Aerobic/Anaerobic Culture w Gram Stain (surgical/deep wound)     Status: None   Collection Time: 03/22/22  3:49 PM   Specimen: Soft Tissue, Other  Result Value Ref Range Status   Specimen Description TISSUE RIGHT HIP  Final   Special Requests NONE  Final   Gram Stain   Final    RARE WBC PRESENT,BOTH PMN AND MONONUCLEAR NO ORGANISMS SEEN    Culture   Final    No growth aerobically or anaerobically. Performed at [facility], [place] [address] [place], [place] [address]    Report Status 03/27/2022 FINAL  Final    Labs: CBC: Recent Labs  Lab 03/23/22 0627 03/24/22 0552 03/25/22 0008 03/27/22 0446 03/28/22 0428  WBC 14.6* 12.1* 11.3* 11.3* 11.1*  HGB 11.1* 10.8* 10.8* 11.8*  11.3*  HCT 33.7* 32.3* 32.7* 34.4* 33.4*  MCV 98.5 98.8 99.1 96.6 97.1  PLT 169 159 184 212 [id number]   Basic Metabolic Panel: Recent Labs  Lab 03/23/22 0627 03/24/22 0552 03/25/22 0008 03/27/22 0446 03/28/22 0428  NA 135 137 134* 132* 132*  K 3.9 3.3* 3.7 3.7 3.9  CL 101 103 100 95* 96*  CO2 '24 26 27 26 28  '$ GLUCOSE 131* 103* 101* 127* 106*  BUN 11 14 5* 6 <5*  CREATININE 0.68 0.66 0.69 0.67 0.69  CALCIUM 8.6* 8.4* 8.6* 8.8* 9.0   Liver Function Tests: Recent Labs  Lab 03/24/22 0552 03/25/22 0008 03/27/22 0446  AST 15 20 33  ALT 14 14 42  ALKPHOS 38 35* 43  BILITOT 0.6 0.7 0.3  PROT 5.5* 5.8* 6.0*  ALBUMIN 2.4* 2.6* 2.5*   CBG: Recent Labs  Lab 03/26/22 0745 03/26/22 1211  GLUCAP 104* 98    Discharge time spent: less than 30 minutes.  Signed: Marylu Lund, MD [facility] 03/28/2022

## 2022-03-28 NOTE — Plan of Care (Signed)
  Problem: Education: Goal: Knowledge of General Education information will improve Description: Including pain rating scale, medication(s)/side effects and non-pharmacologic comfort measures Outcome: Adequate for Discharge   Problem: Health Behavior/Discharge Planning: Goal: Ability to manage health-related needs will improve Outcome: Adequate for Discharge   Problem: Clinical Measurements: Goal: Ability to maintain clinical measurements within normal limits will improve Outcome: Adequate for Discharge Goal: Will remain free from infection Outcome: Adequate for Discharge Goal: Diagnostic test results will improve Outcome: Adequate for Discharge Goal: Respiratory complications will improve Outcome: Adequate for Discharge Goal: Cardiovascular complication will be avoided Outcome: Adequate for Discharge   Problem: Activity: Goal: Risk for activity intolerance will decrease Outcome: Adequate for Discharge   Problem: Nutrition: Goal: Adequate nutrition will be maintained Outcome: Adequate for Discharge   Problem: Coping: Goal: Level of anxiety will decrease Outcome: Adequate for Discharge   Problem: Elimination: Goal: Will not experience complications related to bowel motility Outcome: Adequate for Discharge Goal: Will not experience complications related to urinary retention Outcome: Adequate for Discharge   Problem: Pain Managment: Goal: General experience of comfort will improve Outcome: Adequate for Discharge   Problem: Safety: Goal: Ability to remain free from injury will improve Outcome: Adequate for Discharge   Problem: Skin Integrity: Goal: Risk for impaired skin integrity will decrease Outcome: Adequate for Discharge   Problem: Acute Rehab PT Goals(only PT should resolve) Goal: Pt Will Go Supine/Side To Sit Outcome: Adequate for Discharge Goal: Patient Will Transfer Sit To/From Stand Outcome: Adequate for Discharge Goal: Pt Will Transfer Bed To Chair/Chair  To Bed Outcome: Adequate for Discharge Goal: Pt Will Ambulate Outcome: Adequate for Discharge Goal: Pt Will Go Up/Down Stairs Outcome: Adequate for Discharge Goal: Pt Will Verbalize and Adhere to Precautions While Description: PT Will Verbalize and Adhere to Precautions While Performing Mobility Outcome: Adequate for Discharge Goal: Pt/caregiver will Perform Home Exercise Program Outcome: Adequate for Discharge

## 2022-03-28 NOTE — Progress Notes (Signed)
Patient given her dose of abx. And her discharge instructions and stated understanding.

## 2022-04-18 ENCOUNTER — Ambulatory Visit: Payer: Self-pay | Admitting: Internal Medicine

## 2022-04-18 NOTE — Progress Notes (Deleted)
[facility]  CHIEF COMPLAINT:    Follow up for septic arthritis of the hip  SUBJECTIVE:    Kristen Robles is a 32 y.o. female with PMHx as below who presents to the clinic for septic arthritis of the hip.   Patient was admitted at [facility] from 2/18 through 03/28/2022 due to right hip septic arthritis.  She was taken to the OR by orthopedic surgery on 03/22/2022 for I&D.  She was treated with IV antibiotics during her admission.  Her operative cultures were finalized as no growth.  Additionally, her blood cultures were also negative.  She was discharged on doxycycline and ciprofloxacin by mouth to complete 4 weeks of antibiotics total from the date of her surgery.  She had a baseline ESR of 17 and CRP of 6.7.  She was seen in the emergency department at [facility] on 04/01/2022 due to hip pain.  She underwent CT scan and MRI which showed a small right hip joint effusion as well as a rim-enhancing fluid collection extending anterior laterally from the hip joint capsule into the overlying musculature measuring 3.3 x 2 cm.  This was suspected either abscess or postsurgical fluid.  There was also another fluid collection projecting inferior and medial from the hip joint capsule into the region of the abductor muscles measuring 4 x 2.6 cm.  MRI reports that those 2 collections may communicate.  The emergency department advised strict return precautions and subsequently discharged her from the ED.  She had a follow-up 3 days later on 04/04/2022 with her PCP. ***.     Please see A&P for the details of today's visit and status of the patient's medical problems.   Patient's Medications  New Prescriptions   No medications on file  Previous Medications   CIPROFLOXACIN (CIPRO) 750 MG TABLET    Take 1 tablet (750 mg total) by mouth 2 (two) times daily for 22 days.   DOCUSATE SODIUM (COLACE) 100 MG CAPSULE    Take 1 capsule (100 mg total) by  mouth 2 (two) times daily.   DOXYCYCLINE (VIBRA-TABS) 100 MG TABLET    Take 1 tablet (100 mg total) by mouth 2 (two) times daily for 22 days.   HYDROCODONE-ACETAMINOPHEN (NORCO/VICODIN) 5-325 MG TABLET    Take 1 tablet by mouth every 4 (four) hours as needed for moderate pain or severe pain.   METHOCARBAMOL (ROBAXIN) 500 MG TABLET    Take 2 tablets (1,000 mg total) by mouth 3 (three) times daily.   ONDANSETRON (ZOFRAN) 4 MG TABLET    Take 1 tablet (4 mg total) by mouth every 6 (six) hours as needed for nausea.   PANTOPRAZOLE (PROTONIX) 40 MG TABLET    Take 1 tablet (40 mg total) by mouth daily.   POLYETHYLENE GLYCOL POWDER (GLYCOLAX/MIRALAX) 17 GM/SCOOP POWDER    Take 17 g by mouth daily as needed for moderate constipation.  Modified Medications   No medications on file  Discontinued Medications   No medications on file      No past medical history on file.  Social History   Tobacco Use   Smoking status: Never  Substance Use Topics   Alcohol use: Yes    Alcohol/week: 1.0 standard drink of alcohol    Types: 1 Glasses of wine per week   Drug use: No    No family history on file.  Allergies  Allergen Reactions   Oxycodone-Acetaminophen Itching and Rash  Tomato Anaphylaxis and Hives    ROS   OBJECTIVE:    There were no vitals filed for this visit. There is no height or weight on file to calculate BMI.  Physical Exam   Labs and Microbiology:    Latest Ref Rng & Units 03/28/2022    4:28 AM 03/27/2022    4:46 AM 03/25/2022   12:08 AM  CBC  WBC 4.0 - 10.5 K/uL 11.1  11.3  11.3   Hemoglobin 12.0 - 15.0 g/dL 11.3  11.8  10.8   Hematocrit 36.0 - 46.0 % 33.4  34.4  32.7   Platelets 150 - 400 K/uL 238  212  184       Latest Ref Rng & Units 03/28/2022    4:28 AM 03/27/2022    4:46 AM 03/25/2022   12:08 AM  CMP  Glucose 70 - 99 mg/dL 106  127  101   BUN 6 - 20 mg/dL 5  6  5    Creatinine 0.44 - 1.00 mg/dL 0.69  0.67  0.69   Sodium 135 - 145 mmol/L 132  132  134    Potassium 3.5 - 5.1 mmol/L 3.9  3.7  3.7   Chloride 98 - 111 mmol/L 96  95  100   CO2 22 - 32 mmol/L 28  26  27    Calcium 8.9 - 10.3 mg/dL 9.0  8.8  8.6   Total Protein 6.5 - 8.1 g/dL  6.0  5.8   Total Bilirubin 0.3 - 1.2 mg/dL  0.3  0.7   Alkaline Phos 38 - 126 U/L  43  35   AST 15 - 41 U/L  33  20   ALT 0 - 44 U/L  42  14      No results found for this or any previous visit (from the past 240 hour(s)).  Imaging: ***   ASSESSMENT & PLAN:    No problem-specific Assessment & Plan notes found for this encounter.   No orders of the defined types were placed in this encounter.    There are no diagnoses linked to this encounter.  ***   Rayn[facility] 04/18/2022, 12:10 PM
